# Patient Record
Sex: Male | Born: 1995 | Race: White | Hispanic: No | Marital: Single | Smoking: Never smoker
Health system: Southern US, Community
[De-identification: ages and names within clinical notes are randomized; demographics above are authoritative.]

## PROBLEM LIST (undated history)

## (undated) DIAGNOSIS — G809 Cerebral palsy, unspecified: Secondary | ICD-10-CM

## (undated) DIAGNOSIS — E079 Disorder of thyroid, unspecified: Secondary | ICD-10-CM

## (undated) DIAGNOSIS — Q820 Hereditary lymphedema: Secondary | ICD-10-CM

## (undated) DIAGNOSIS — R531 Weakness: Secondary | ICD-10-CM

## (undated) DIAGNOSIS — H9190 Unspecified hearing loss, unspecified ear: Secondary | ICD-10-CM

## (undated) DIAGNOSIS — Q9389 Other deletions from the autosomes: Secondary | ICD-10-CM

## (undated) HISTORY — PX: GASTROSTOMY TUBE PLACEMENT: SHX655

## (undated) HISTORY — PX: HERNIA REPAIR: SHX51

## (undated) HISTORY — PX: COARCTATION OF AORTA REPAIR: SHX261

---
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
## 2016-09-28 ENCOUNTER — Encounter (HOSPITAL_BASED_OUTPATIENT_CLINIC_OR_DEPARTMENT_OTHER): Payer: Self-pay

## 2016-09-28 ENCOUNTER — Emergency Department (HOSPITAL_BASED_OUTPATIENT_CLINIC_OR_DEPARTMENT_OTHER)
Admission: EM | Admit: 2016-09-28 | Discharge: 2016-09-28 | Disposition: A | Discharge status: Home / Self Care | Payer: Medicaid Other | Attending: Emergency Medicine | Admitting: Emergency Medicine

## 2016-09-28 ENCOUNTER — Emergency Department (HOSPITAL_BASED_OUTPATIENT_CLINIC_OR_DEPARTMENT_OTHER): Payer: Medicaid Other

## 2016-09-28 DIAGNOSIS — Y929 Unspecified place or not applicable: Secondary | ICD-10-CM | POA: Diagnosis not present

## 2016-09-28 DIAGNOSIS — S0990XA Unspecified injury of head, initial encounter: Secondary | ICD-10-CM | POA: Diagnosis present

## 2016-09-28 DIAGNOSIS — S0003XA Contusion of scalp, initial encounter: Secondary | ICD-10-CM | POA: Diagnosis not present

## 2016-09-28 DIAGNOSIS — Z79899 Other long term (current) drug therapy: Secondary | ICD-10-CM | POA: Insufficient documentation

## 2016-09-28 DIAGNOSIS — Y939 Activity, unspecified: Secondary | ICD-10-CM | POA: Diagnosis not present

## 2016-09-28 DIAGNOSIS — X58XXXA Exposure to other specified factors, initial encounter: Secondary | ICD-10-CM | POA: Diagnosis not present

## 2016-09-28 DIAGNOSIS — E039 Hypothyroidism, unspecified: Secondary | ICD-10-CM | POA: Insufficient documentation

## 2016-09-28 DIAGNOSIS — Y999 Unspecified external cause status: Secondary | ICD-10-CM | POA: Diagnosis not present

## 2016-09-28 HISTORY — DX: Unspecified hearing loss, unspecified ear: H91.90

## 2016-09-28 HISTORY — DX: Weakness: R53.1

## 2016-09-28 HISTORY — DX: Disorder of thyroid, unspecified: E07.9

## 2016-09-28 MED ORDER — MIDAZOLAM HCL 2 MG/ML PO SYRP: 2.0000 mg | ORAL_SOLUTION | Freq: Once | ORAL | Status: DC

## 2016-09-28 MED ORDER — MIDAZOLAM HCL 2 MG/ML PO SYRP
2.0000 mg | ORAL_SOLUTION | Freq: Once | ORAL | Status: AC
  Administered 2016-09-28: 2 mg
  Filled 2016-09-28: qty 2

## 2016-09-28 NOTE — ED Triage Notes (Addendum)
Pt's mother states she noticed some swelling to the L side of his head that started yesterday which became noticeably worse today. Pt's mother states that the area does not seem to bother him and he does not appear in pain. Pt's mother and caregiver deny trauma  Pt is non-verbal with incomprehensible vocalizations at baseline per hx.

## 2016-09-28 NOTE — Discharge Instructions (Signed)
Return to the ED with any concerns including vomiting, seizure activity, decreased level of alertness/lethargy, or any other alarming symptoms °

## 2016-09-28 NOTE — ED Provider Notes (Signed)
[facility]-EMERGENCY DEPT Provider Note   CSN: [id number] Arrival date & time: 09/28/16  1656     History   Chief Complaint Chief Complaint  Patient presents with  . Head Swelling    HPI Elijah Stanley is a 21 y.o. male.  HPI  Pt presenting with c/o area of swelling on the left side of his head.  He has hx of severe developmental delay and cerebral palsy- mom states he does bang his head, she is not aware of any specific injury.  She first noted the area yesterday but it has increased in size, he has been acting at his baseline. He is nonverbal but makes vocalizations and these are at baseline.  He seems to act as though it is painful to touch the area on the left side of head.  No seizure activity.  No LOC.  No vomiting.  There are no other associated systemic symptoms, there are no other alleviating or modifying factors.   Past Medical History:  Diagnosis Date  . Cerebral palsy (HCC)   . Chromosome 1 deletion syndrome   . Hearing loss    bilateral  . Milroy lymphedema   . Thyroid disease    hypothyroidism  . Unilateral weakness    Left    There are no active problems to display for this patient.   Past Surgical History:  Procedure Laterality Date  . COARCTATION OF AORTA REPAIR    . GASTROSTOMY TUBE PLACEMENT    . HERNIA REPAIR         Home Medications    Prior to Admission medications   Medication Sig Start Date End Date Taking? Authorizing Provider  amoxicillin-clavulanate (AUGMENTIN) 250-62.5 MG/5ML suspension Take 7.8 mLs (390 mg total) by mouth 3 (three) times daily. 10/07/14   Rolland PorterMark James, MD  levothyroxine (SYNTHROID, LEVOTHROID) 50 MCG tablet Take 50 mcg by mouth daily before breakfast.    Historical Provider, MD  Omeprazole (PRILOSEC PO) Take by mouth.    Historical Provider, MD  polyethylene glycol (MIRALAX / GLYCOLAX) packet Take 17 g by mouth daily.    Historical Provider, MD    Family History No family history on file.  Social History Social  History  Substance Use Topics  . Smoking status: Never Smoker  . Smokeless tobacco: Never Used  . Alcohol use No     Allergies   Rocephin [ceftriaxone sodium in dextrose]   Review of Systems Review of Systems  ROS reviewed and all otherwise negative except for mentioned in HPI   Physical Exam Updated Vital Signs BP 122/82 (BP Location: Right Arm)   Pulse 96   Temp 97.8 F (36.6 C) (Axillary)   Resp 16   Ht 4\' 2"  (1.27 m)   Wt 27.2 kg   SpO2 100%   BMI 16.87 kg/m  Vitals reviewed Physical Exam Physical Examination: General appearance -alert, nonspecific vocalizations, moving around bed, holding my hand Mental status - alert, not oriented, this is his baseline Head- contusion overlying left temporal area of scalp, tender to palpation, no stepoffs Eyes - no conjunctival injection, no scleral icterus Ears - bilateral TM's and external ear canals normal, no hemotympanum Mouth - mucous membranes moist, pharynx normal without lesions Neck - supple, no significant adenopathy Chest - clear to auscultation, no wheezes, rales or rhonchi, symmetric air entry Heart - normal rate, regular rhythm, normal S1, S2, no murmurs, rubs, clicks or gallops Abdomen - soft, nontender, nondistended, no masses or organomegaly Neurological - alert, vocalizing, moving all extremities,  at baseline per mother Extremities - peripheral pulses normal, no pedal edema, no clubbing or cyanosis Skin - normal coloration and turgor, no rashes  ED Treatments / Results  Labs (all labs ordered are listed, but only abnormal results are displayed) Labs Reviewed - No data to display  EKG  EKG Interpretation None       Radiology Ct Head Wo Contrast  Result Date: 09/28/2016 CLINICAL DATA:  Swelling of the left side of the head worsening over the day. History of cerebral palsy. EXAM: CT HEAD WITHOUT CONTRAST TECHNIQUE: Contiguous axial images were obtained from the base of the skull through the vertex  without intravenous contrast. COMPARISON:  None. 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 Calcification/ lipoma in the minimal dysplastic corpus callosum . 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 No evidence of acquired brain pathology. No sign of obstructive hydrocephalus. No extra-axial collection. No hemorrhage. Vascular: No vascular finding. Skull: No traumatic calvarial finding. Sinuses/Orbits: Clear/normal Other: Swelling of the left temporal soft tissues, completely nonspecific. This could be due to inflammation or hemorrhage. IMPRESSION: Nonspecific swelling of the left temporal extracranial soft tissues. This could be due to inflammation or hemorrhage. Multiple congenital brain anomalies. 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 Markedly hypoplastic corpus callosum with calcification. Electronically Signed   By: Paulina Fusi M.D.   On: 09/28/2016 19:21    Procedures Procedures (including critical care time)  Medications Ordered in ED Medications  midazolam (VERSED) 2 MG/ML syrup 2 mg (2 mg Per Tube Given 09/28/16 1843)     Initial Impression / Assessment and Plan / ED Course  I have reviewed the triage vital signs and the nursing notes.  Pertinent labs & imaging results that were available during my care of the patient were reviewed by me and considered in my medical decision making (see chart for details).     Pt with hematoma overlying left side of scalp, no specific injury known but patient does engage in head banging at times per mom.  Pt given oral versed and was able to tolerate having CT scan performed.  This showed an abnormal brain c/w patient's underlying condition but no intracranial  bleed or skull fracture.  Discharged with strict return precautions.  Pt agreeable with plan.  Final Clinical Impressions(s) / ED Diagnoses   Final diagnoses:  Head trauma  Contusion of scalp, initial encounter    New Prescriptions Discharge Medication List as of 09/28/2016  7:40 PM       Jerelyn Scott, MD 09/29/16 2016

## 2017-02-01 ENCOUNTER — Telehealth (INDEPENDENT_AMBULATORY_CARE_PROVIDER_SITE_OTHER): Payer: Self-pay

## 2017-02-01 NOTE — Telephone Encounter (Signed)
Dustin with [facility] would like to know if you have received a form for PA for Dr. Ophelia CharterYates to sign and office notes from 04/20/2016-10/18/2016 faxed to [phone].  Cb# is [phone].

## 2017-02-02 NOTE — Telephone Encounter (Signed)
IC, SPOKE TO DUSTIN AND ADVISED I HAVE NOT SEEN HIS FAX AND SUGGESTED THAT HE REFAX TO [phone]

## 2017-02-02 NOTE — Telephone Encounter (Signed)
Are you aware if you have seen this come in? Thanks.

## 2017-03-23 DIAGNOSIS — F411 Generalized anxiety disorder: Secondary | ICD-10-CM | POA: Diagnosis present

## 2020-07-31 ENCOUNTER — Ambulatory Visit: Payer: Medicaid Other | Attending: Internal Medicine

## 2020-07-31 DIAGNOSIS — Z23 Encounter for immunization: Secondary | ICD-10-CM

## 2020-07-31 NOTE — Progress Notes (Signed)
   Covid-19 Vaccination Clinic  Name:  RAMIRO PANGILINAN    MRN: [id number] DOB: 1995-08-24  07/31/2020  Mr. Lucena was observed post Covid-19 immunization for 15 minutes without incident. He was provided with Vaccine Information Sheet and instruction to access the V-Safe system.   Mr. Sowash was instructed to call 911 with any severe reactions post vaccine: Marland Kitchen Difficulty breathing  . Swelling of face and throat  . A fast heartbeat  . A bad rash all over body  . Dizziness and weakness   Immunizations Administered    Name Date Dose VIS Date Route   Pfizer COVID-19 Vaccine 07/31/2020  2:28 PM 0.3 mL 06/03/2020 Intramuscular   Manufacturer: [employer], [employer]   Lot: [id number]   NDC: [address]-4

## 2020-09-04 ENCOUNTER — Emergency Department (HOSPITAL_BASED_OUTPATIENT_CLINIC_OR_DEPARTMENT_OTHER): Payer: Medicaid Other

## 2020-09-04 ENCOUNTER — Encounter (HOSPITAL_BASED_OUTPATIENT_CLINIC_OR_DEPARTMENT_OTHER): Payer: Self-pay | Admitting: Emergency Medicine

## 2020-09-04 ENCOUNTER — Inpatient Hospital Stay (HOSPITAL_BASED_OUTPATIENT_CLINIC_OR_DEPARTMENT_OTHER)
Admission: EM | Admit: 2020-09-04 | Discharge: 2020-09-08 | DRG: 392 | Disposition: A | Discharge status: Home / Self Care | Payer: Medicaid Other | Attending: Family Medicine | Admitting: Family Medicine

## 2020-09-04 ENCOUNTER — Other Ambulatory Visit: Payer: Self-pay

## 2020-09-04 DIAGNOSIS — Z20822 Contact with and (suspected) exposure to covid-19: Secondary | ICD-10-CM | POA: Diagnosis present

## 2020-09-04 DIAGNOSIS — R630 Anorexia: Secondary | ICD-10-CM | POA: Diagnosis present

## 2020-09-04 DIAGNOSIS — R1310 Dysphagia, unspecified: Secondary | ICD-10-CM | POA: Diagnosis present

## 2020-09-04 DIAGNOSIS — F419 Anxiety disorder, unspecified: Secondary | ICD-10-CM | POA: Diagnosis present

## 2020-09-04 DIAGNOSIS — E079 Disorder of thyroid, unspecified: Secondary | ICD-10-CM | POA: Diagnosis present

## 2020-09-04 DIAGNOSIS — E876 Hypokalemia: Secondary | ICD-10-CM | POA: Diagnosis present

## 2020-09-04 DIAGNOSIS — E860 Dehydration: Secondary | ICD-10-CM | POA: Diagnosis present

## 2020-09-04 DIAGNOSIS — K56609 Unspecified intestinal obstruction, unspecified as to partial versus complete obstruction: Secondary | ICD-10-CM

## 2020-09-04 DIAGNOSIS — Z888 Allergy status to other drugs, medicaments and biological substances status: Secondary | ICD-10-CM

## 2020-09-04 DIAGNOSIS — Z931 Gastrostomy status: Secondary | ICD-10-CM

## 2020-09-04 DIAGNOSIS — K529 Noninfective gastroenteritis and colitis, unspecified: Secondary | ICD-10-CM | POA: Diagnosis present

## 2020-09-04 DIAGNOSIS — G809 Cerebral palsy, unspecified: Secondary | ICD-10-CM | POA: Diagnosis present

## 2020-09-04 DIAGNOSIS — Z0189 Encounter for other specified special examinations: Secondary | ICD-10-CM

## 2020-09-04 DIAGNOSIS — H9190 Unspecified hearing loss, unspecified ear: Secondary | ICD-10-CM | POA: Diagnosis present

## 2020-09-04 DIAGNOSIS — Z79890 Hormone replacement therapy (postmenopausal): Secondary | ICD-10-CM

## 2020-09-04 DIAGNOSIS — Z79899 Other long term (current) drug therapy: Secondary | ICD-10-CM

## 2020-09-04 DIAGNOSIS — Z6825 Body mass index (BMI) 25.0-25.9, adult: Secondary | ICD-10-CM

## 2020-09-04 DIAGNOSIS — E039 Hypothyroidism, unspecified: Secondary | ICD-10-CM | POA: Diagnosis present

## 2020-09-04 LAB — CBC WITH DIFFERENTIAL/PLATELET
Abs Immature Granulocytes: 0.1 10*3/uL — ABNORMAL HIGH (ref 0.00–0.07)
Basophils Absolute: 0.1 10*3/uL (ref 0.0–0.1)
Basophils Relative: 1 %
Eosinophils Absolute: 0 10*3/uL (ref 0.0–0.5)
Eosinophils Relative: 0 %
HCT: 48.2 % (ref 39.0–52.0)
Hemoglobin: 16.1 g/dL (ref 13.0–17.0)
Immature Granulocytes: 1 %
Lymphocytes Relative: 13 %
Lymphs Abs: 1.5 10*3/uL (ref 0.7–4.0)
MCH: 29.9 pg (ref 26.0–34.0)
MCHC: 33.4 g/dL (ref 30.0–36.0)
MCV: 89.6 fL (ref 80.0–100.0)
Monocytes Absolute: 1.6 10*3/uL — ABNORMAL HIGH (ref 0.1–1.0)
Monocytes Relative: 14 %
Neutro Abs: 8.3 10*3/uL — ABNORMAL HIGH (ref 1.7–7.7)
Neutrophils Relative %: 71 %
Platelets: 215 10*3/uL (ref 150–400)
RBC: 5.38 MIL/uL (ref 4.22–5.81)
RDW: 14.1 % (ref 11.5–15.5)
Smear Review: NORMAL
WBC: 11.6 10*3/uL — ABNORMAL HIGH (ref 4.0–10.5)
nRBC: 0 % (ref 0.0–0.2)

## 2020-09-04 LAB — COMPREHENSIVE METABOLIC PANEL
ALT: 15 U/L (ref 0–44)
AST: 22 U/L (ref 15–41)
Albumin: 2.7 g/dL — ABNORMAL LOW (ref 3.5–5.0)
Alkaline Phosphatase: 58 U/L (ref 38–126)
Anion gap: 8 (ref 5–15)
BUN: 24 mg/dL — ABNORMAL HIGH (ref 6–20)
CO2: 18 mmol/L — ABNORMAL LOW (ref 22–32)
Calcium: 6.1 mg/dL — CL (ref 8.9–10.3)
Chloride: 112 mmol/L — ABNORMAL HIGH (ref 98–111)
Creatinine, Ser: 0.47 mg/dL — ABNORMAL LOW (ref 0.61–1.24)
GFR, Estimated: >60 mL/min (ref >60)
Glucose, Bld: 74 mg/dL (ref 70–99)
Potassium: 3.3 mmol/L — ABNORMAL LOW (ref 3.5–5.1)
Sodium: 138 mmol/L (ref 135–145)
Total Bilirubin: 0.9 mg/dL (ref 0.3–1.2)
Total Protein: 4.7 g/dL — ABNORMAL LOW (ref 6.5–8.1)

## 2020-09-04 LAB — I-STAT VENOUS BLOOD GAS, ED
Acid-Base Excess: 1 mmol/L (ref 0.0–2.0)
Bicarbonate: 26.6 mmol/L (ref 20.0–28.0)
Calcium, Ion: 1.2 mmol/L (ref 1.15–1.40)
HCT: 46 % (ref 39.0–52.0)
Hemoglobin: 15.6 g/dL (ref 13.0–17.0)
O2 Saturation: 78 %
Potassium: 5.9 mmol/L — ABNORMAL HIGH (ref 3.5–5.1)
Sodium: 133 mmol/L — ABNORMAL LOW (ref 135–145)
TCO2: 28 mmol/L (ref 22–32)
pCO2, Ven: 46 mmHg (ref 44.0–60.0)
pH, Ven: 7.37 (ref 7.250–7.430)
pO2, Ven: 44 mmHg (ref 32.0–45.0)

## 2020-09-04 LAB — LACTIC ACID, PLASMA: Lactic Acid, Venous: 1.6 mmol/L (ref 0.5–1.9)

## 2020-09-04 LAB — TROPONIN I (HIGH SENSITIVITY): Troponin I (High Sensitivity): <2 ng/L (ref <18)

## 2020-09-04 LAB — SARS CORONAVIRUS 2 BY RT PCR (HOSPITAL ORDER, PERFORMED IN ~~LOC~~ HOSPITAL LAB): SARS Coronavirus 2: NEGATIVE

## 2020-09-04 MED ORDER — IOHEXOL 300 MG/ML  SOLN
100.0000 mL | Freq: Once | INTRAMUSCULAR | Status: AC
  Administered 2020-09-04: 100 mL via INTRAVENOUS

## 2020-09-04 MED ORDER — LACTATED RINGERS IV SOLN: INTRAVENOUS | Status: DC

## 2020-09-04 MED ORDER — LACTATED RINGERS IV BOLUS
500.0000 mL | Freq: Once | INTRAVENOUS | Status: AC
  Administered 2020-09-04: 500 mL via INTRAVENOUS

## 2020-09-04 NOTE — ED Notes (Signed)
Critical lab received; Calcium 6.1; Dr Stevie Kern aware.

## 2020-09-04 NOTE — ED Provider Notes (Signed)
[facility] EMERGENCY DEPARTMENT Provider Note   CSN: [id number] Arrival date & time: 09/04/20  1546     History Chief Complaint  Patient presents with  . Fever  . Diarrhea    Elijah Stanley is a 25 y.o. male.  Presenting to ER with concern for decreased p.o. intake, chills, diarrhea, vomiting.  Mother reports over the past 2 or 3 days patient has stopped taking oral intake.  Normally eats solids and receives fluids through G-tube.  Had 1 episode of vomiting earlier today, nonbloody nonbilious.  Had a couple episodes of loose stool.  Very extensive medical history including history of chromosomal deletion, cerebral palsy.  Coarctation of aorta postrepair.  Gastrostomy tube placement (2012). HPI     Past Medical History:  Diagnosis Date  . Cerebral palsy (HCC)   . Chromosome 1 deletion syndrome   . Hearing loss    bilateral  . Milroy lymphedema   . Thyroid disease    hypothyroidism  . Unilateral weakness    Left    There are no problems to display for this patient.   Past Surgical History:  Procedure Laterality Date  . COARCTATION OF AORTA REPAIR    . GASTROSTOMY TUBE PLACEMENT    . HERNIA REPAIR         No family history on file.  Social History   Tobacco Use  . Smoking status: Never Smoker  . Smokeless tobacco: Never Used  Substance Use Topics  . Alcohol use: No  . Drug use: No    Home Medications Prior to Admission medications   Medication Sig Start Date End Date Taking? Authorizing Provider  amoxicillin-clavulanate (AUGMENTIN) 250-62.5 MG/5ML suspension Take 7.8 mLs (390 mg total) by mouth 3 (three) times daily. 10/07/14   Rolland Porter, MD  levothyroxine (SYNTHROID, LEVOTHROID) 50 MCG tablet Take 50 mcg by mouth daily before breakfast.    [provider]  Omeprazole (PRILOSEC PO) Take by mouth.    [provider]  polyethylene glycol (MIRALAX / GLYCOLAX) packet Take 17 g by mouth daily.    [provider]     Allergies    Rocephin [ceftriaxone sodium in dextrose]  Review of Systems   Review of Systems  Unable to perform ROS: Patient nonverbal    Physical Exam Updated Vital Signs BP 138/87 (BP Location: Left Arm)   Pulse (!) 113   Temp 98.4 F (36.9 C) (Rectal)   Resp (!) 22   Ht 4' (1.219 m)   Wt 37.2 kg   SpO2 100%   BMI 25.02 kg/m   Physical Exam Vitals and nursing note reviewed.  Constitutional:      Appearance: He is well-developed and well-nourished.     Comments: Very small young male  HENT:     Head: Normocephalic and atraumatic.  Eyes:     Conjunctiva/sclera: Conjunctivae normal.  Cardiovascular:     Rate and Rhythm: Normal rate and regular rhythm.     Heart sounds: No murmur heard.   Pulmonary:     Effort: Pulmonary effort is normal. No respiratory distress.     Breath sounds: Normal breath sounds.  Abdominal:     Palpations: Abdomen is soft.     Tenderness: There is no abdominal tenderness.     Comments: Generalized tenderness but no rebound or guarding, G-tube is intact  Musculoskeletal:        General: No edema.     Cervical back: Neck supple.     Comments: Chronic contractures  Skin:    General: Skin is warm and dry.     Coloration: Skin is pale.  Neurological:     General: No focal deficit present.     Mental Status: He is alert.     Comments: Nonverbal  Psychiatric:        Mood and Affect: Mood and affect and mood normal.        Behavior: Behavior normal.     ED Results / Procedures / Treatments   Labs (all labs ordered are listed, but only abnormal results are displayed) Labs Reviewed  CBC WITH DIFFERENTIAL/PLATELET - Abnormal; Notable for the following components:      Result Value   WBC 11.6 (*)    Neutro Abs 8.3 (*)    Monocytes Absolute 1.6 (*)    Abs Immature Granulocytes 0.10 (*)    All other components within normal limits  COMPREHENSIVE METABOLIC PANEL - Abnormal; Notable for the following components:   Potassium 3.3 (*)     Chloride 112 (*)    CO2 18 (*)    BUN 24 (*)    Creatinine, Ser 0.47 (*)    Calcium 6.1 (*)    Total Protein 4.7 (*)    Albumin 2.7 (*)    All other components within normal limits  I-STAT VENOUS BLOOD GAS, ED - Abnormal; Notable for the following components:   Sodium 133 (*)    Potassium 5.9 (*)    All other components within normal limits  SARS CORONAVIRUS 2 BY RT PCR (HOSPITAL ORDER, PERFORMED IN [facility])  CULTURE, BLOOD (ROUTINE X 2)  URINE CULTURE  CULTURE, BLOOD (SINGLE)  LACTIC ACID, PLASMA  URINALYSIS, ROUTINE W REFLEX MICROSCOPIC  CALCIUM, IONIZED  TROPONIN I (HIGH SENSITIVITY)    EKG EKG Interpretation  Date/Time:  Friday September 04 2020 19:12:02 EST Ventricular Rate:  124 PR Interval:    QRS Duration: 70 QT Interval:  276 QTC Calculation: 397 R Axis:   94 Text Interpretation: Sinus tachycardia Consider right atrial enlargement Borderline right axis deviation Repol abnrm suggests ischemia, diffuse leads no prior for comparison Confirmed by Marianna Fuss ([id number]) on 09/04/2020 7:30:53 PM   Radiology CT ABDOMEN PELVIS WO CONTRAST  Result Date: 09/04/2020 CLINICAL DATA:  Abdominal pain and fever.  Diarrhea yesterday. EXAM: CT ABDOMEN AND PELVIS WITHOUT CONTRAST TECHNIQUE: Multidetector CT imaging of the abdomen and pelvis was performed following the standard protocol without IV contrast. COMPARISON:  None. FINDINGS: Lower chest: The lung bases are clear. Dilated esophagus with air-fluid level. Incompletely evaluated. This could represent distal obstruction due to mass or stricture or postoperative change versus achalasia. Surgical clips at the EG junction. Hepatobiliary: No focal liver abnormality is seen. No gallstones, gallbladder wall thickening, or biliary dilatation. Pancreas: Unremarkable. No pancreatic ductal dilatation or surrounding inflammatory changes. Spleen: Normal in size without focal abnormality. Adrenals/Urinary Tract: Adrenal glands  are unremarkable. Kidneys are normal, without renal calculi, focal lesion, or hydronephrosis. Bladder is unremarkable. Stomach/Bowel: Gastrostomy tube inserted along the lower greater curvature of the stomach. Retention balloon is within the stomach. Stomach is mostly decompressed. The colon is filled with solid and liquid stool. Small bowel and colon are moderately distended with air-fluid levels in the small bowel. Some small bowel wall thickening is demonstrated. Changes probably represent enterocolitis but associated small-bowel obstruction is not excluded. Mesenteric lymph nodes are prominent, likely reactive or inflammatory. Vascular/Lymphatic: Normal caliber abdominal aorta. No significant retroperitoneal lymphadenopathy. Reproductive: Prostate gland is not enlarged. Other: No free air  or free fluid demonstrated in the abdomen. Musculoskeletal: No acute bony abnormalities. IMPRESSION: 1. Dilated esophagus with air-fluid level. This could represent distal obstruction due to mass or stricture or postoperative change versus achalasia. 2. Gastrostomy tube inserted along the lower greater curvature of the stomach. Retention balloon is within the stomach. 3. Small bowel and colon are moderately distended with air-fluid levels in the small bowel. Some small bowel wall thickening is demonstrated. Changes probably represent enterocolitis but associated small-bowel obstruction is also a possibility. 4. Mesenteric lymph nodes are prominent, likely reactive or inflammatory. Electronically Signed   By: Burman Nieves M.D.   On: 09/04/2020 22:39   DG Chest 1 View  Result Date: 09/04/2020 CLINICAL DATA:  Fever, diarrhea, cerebral palsy EXAM: CHEST  1 VIEW COMPARISON:  None. FINDINGS: Lung volumes are small, but are symmetric and are clear. No pneumothorax or pleural effusion. Gas seen below the right hemidiaphragm appears encapsulated and likely represents gas within the hepatic flexure of the colon. Cardiac size  within normal limits. Surgical clips seen in the region of the aortic arch in keeping with given history of coarctation repair. Pulmonary vascularity is normal. No acute bone abnormality. IMPRESSION: No active disease. Gas beneath the right hemidiaphragm likely within the hepatic flexure of the colon. Electronically Signed   By: Helyn Numbers MD   On: 09/04/2020 22:38    Procedures Procedures (including critical care time)  Medications Ordered in ED Medications  lactated ringers infusion (has no administration in time range)  lactated ringers bolus 500 mL (0 mLs Intravenous Stopped 09/04/20 1758)  lactated ringers bolus 500 mL (0 mLs Intravenous Stopped 09/04/20 2129)  iohexol (OMNIPAQUE) 300 MG/ML solution 100 mL (100 mLs Intravenous Contrast Given 09/04/20 2144)    ED Course  I have reviewed the triage vital signs and the nursing notes.  Pertinent labs & imaging results that were available during my care of the patient were reviewed by me and considered in my medical decision making (see chart for details).    MDM Rules/Calculators/A&P                         25 year old boy with history of cerebral palsy, chromosomal deletion presents to ER with concern for vomiting, loose stool, decreased p.o. intake.  History limited due to patient's baseline nonverbal status on exam patient appeared dry, abdomen mildly distended but no rebound or guarding.  Lab work noted for mild hypokalemia, hypocalcium, low bicarb.  Concern for dehydration.  Chest x-ray negative for pneumonia.  COVID-negative.  CT abdomen pelvis concerning for dilated esophagus with air-fluid level as well as dilated loops of small and large bowel concerning for possible bowel obstruction versus enterocolitis.  Reviewed case with Dr. Dwain Sarna on-call for general surgery.  No acute surgical needs at this time.  Recommend conservative management, bowel rest, G-tube to gravity.  Provided fluid bolus, started on maintenance IV fluids.   Consult to the hospitalist service, Dr. Arville Care will except to [facility].  Final Clinical Impression(s) / ED Diagnoses Final diagnoses:  Intestinal obstruction, unspecified cause, unspecified whether partial or complete (HCC)  Hypocalcemia  Dehydration    Rx / DC Orders ED Discharge Orders    None       Milagros Loll, MD 09/04/20 2348

## 2020-09-04 NOTE — ED Triage Notes (Addendum)
Per mother he started having diarrhea yesterday and appeared to be uncomfortable.  Today has had a fever and is pale.  Reports when she checked his O2 sat at home his heart rate was 140.  O2 was 96-99 at home.  Per mom pt has not been eating well however he does have a G-tube so she has been giving plenty of fluids.

## 2020-09-05 ENCOUNTER — Inpatient Hospital Stay (HOSPITAL_COMMUNITY): Payer: Medicaid Other

## 2020-09-05 DIAGNOSIS — Z79899 Other long term (current) drug therapy: Secondary | ICD-10-CM | POA: Diagnosis not present

## 2020-09-05 DIAGNOSIS — Z931 Gastrostomy status: Secondary | ICD-10-CM | POA: Diagnosis not present

## 2020-09-05 DIAGNOSIS — E039 Hypothyroidism, unspecified: Secondary | ICD-10-CM | POA: Diagnosis present

## 2020-09-05 DIAGNOSIS — F419 Anxiety disorder, unspecified: Secondary | ICD-10-CM | POA: Diagnosis present

## 2020-09-05 DIAGNOSIS — E860 Dehydration: Secondary | ICD-10-CM | POA: Diagnosis present

## 2020-09-05 DIAGNOSIS — G809 Cerebral palsy, unspecified: Secondary | ICD-10-CM | POA: Diagnosis present

## 2020-09-05 DIAGNOSIS — E079 Disorder of thyroid, unspecified: Secondary | ICD-10-CM | POA: Diagnosis present

## 2020-09-05 DIAGNOSIS — H9190 Unspecified hearing loss, unspecified ear: Secondary | ICD-10-CM | POA: Diagnosis present

## 2020-09-05 DIAGNOSIS — Z6825 Body mass index (BMI) 25.0-25.9, adult: Secondary | ICD-10-CM | POA: Diagnosis not present

## 2020-09-05 DIAGNOSIS — R1310 Dysphagia, unspecified: Secondary | ICD-10-CM | POA: Diagnosis present

## 2020-09-05 DIAGNOSIS — Z79890 Hormone replacement therapy (postmenopausal): Secondary | ICD-10-CM | POA: Diagnosis not present

## 2020-09-05 DIAGNOSIS — K529 Noninfective gastroenteritis and colitis, unspecified: Secondary | ICD-10-CM | POA: Diagnosis present

## 2020-09-05 DIAGNOSIS — Z888 Allergy status to other drugs, medicaments and biological substances status: Secondary | ICD-10-CM | POA: Diagnosis not present

## 2020-09-05 DIAGNOSIS — R630 Anorexia: Secondary | ICD-10-CM | POA: Diagnosis present

## 2020-09-05 DIAGNOSIS — Z20822 Contact with and (suspected) exposure to covid-19: Secondary | ICD-10-CM | POA: Diagnosis present

## 2020-09-05 DIAGNOSIS — E876 Hypokalemia: Secondary | ICD-10-CM | POA: Diagnosis present

## 2020-09-05 LAB — COMPREHENSIVE METABOLIC PANEL
ALT: 18 U/L (ref 0–44)
AST: 26 U/L (ref 15–41)
Albumin: 3.4 g/dL — ABNORMAL LOW (ref 3.5–5.0)
Alkaline Phosphatase: 79 U/L (ref 38–126)
Anion gap: 16 — ABNORMAL HIGH (ref 5–15)
BUN: 24 mg/dL — ABNORMAL HIGH (ref 6–20)
CO2: 18 mmol/L — ABNORMAL LOW (ref 22–32)
Calcium: 9.3 mg/dL (ref 8.9–10.3)
Chloride: 103 mmol/L (ref 98–111)
Creatinine, Ser: 0.96 mg/dL (ref 0.61–1.24)
GFR, Estimated: >60 mL/min (ref >60)
Glucose, Bld: 58 mg/dL — ABNORMAL LOW (ref 70–99)
Potassium: 5.1 mmol/L (ref 3.5–5.1)
Sodium: 137 mmol/L (ref 135–145)
Total Bilirubin: 1.7 mg/dL — ABNORMAL HIGH (ref 0.3–1.2)
Total Protein: 6 g/dL — ABNORMAL LOW (ref 6.5–8.1)

## 2020-09-05 LAB — URINALYSIS, ROUTINE W REFLEX MICROSCOPIC
Bacteria, UA: NONE SEEN
Bilirubin Urine: NEGATIVE
Glucose, UA: >=500 mg/dL — AB
Hgb urine dipstick: NEGATIVE
Ketones, ur: 80 mg/dL — AB
Leukocytes,Ua: NEGATIVE
Nitrite: NEGATIVE
Protein, ur: NEGATIVE mg/dL
Specific Gravity, Urine: 1.028 (ref 1.005–1.030)
pH: 5 (ref 5.0–8.0)

## 2020-09-05 LAB — CBC WITH DIFFERENTIAL/PLATELET
Abs Immature Granulocytes: 0.05 10*3/uL (ref 0.00–0.07)
Basophils Absolute: 0 10*3/uL (ref 0.0–0.1)
Basophils Relative: 1 %
Eosinophils Absolute: 0 10*3/uL (ref 0.0–0.5)
Eosinophils Relative: 0 %
HCT: 42.4 % (ref 39.0–52.0)
Hemoglobin: 14.1 g/dL (ref 13.0–17.0)
Immature Granulocytes: 1 %
Lymphocytes Relative: 18 %
Lymphs Abs: 2 10*3/uL (ref 0.7–4.0)
MCH: 30.1 pg (ref 26.0–34.0)
MCHC: 33.3 g/dL (ref 30.0–36.0)
MCV: 90.4 fL (ref 80.0–100.0)
Monocytes Absolute: 1.7 10*3/uL — ABNORMAL HIGH (ref 0.1–1.0)
Monocytes Relative: 18 %
Neutro Abs: 6.2 10*3/uL (ref 1.7–7.7)
Neutrophils Relative %: 63 %
Platelets: 158 10*3/uL (ref 150–400)
RBC: 4.69 MIL/uL (ref 4.22–5.81)
RDW: 14 % (ref 11.5–15.5)
WBC: 9.8 10*3/uL (ref 4.0–10.5)
nRBC: 0 % (ref 0.0–0.2)

## 2020-09-05 LAB — GLUCOSE, CAPILLARY
Glucose-Capillary: 173 mg/dL — ABNORMAL HIGH (ref 70–99)
Glucose-Capillary: 52 mg/dL — ABNORMAL LOW (ref 70–99)

## 2020-09-05 LAB — CULTURE, BLOOD (ROUTINE X 2)

## 2020-09-05 LAB — MAGNESIUM: Magnesium: 1.9 mg/dL (ref 1.7–2.4)

## 2020-09-05 MED ORDER — DEXTROSE-NACL 5-0.45 % IV SOLN: INTRAVENOUS | Status: DC

## 2020-09-05 MED ORDER — DEXTROSE 50 % IV SOLN
INTRAVENOUS | Status: AC
  Administered 2020-09-05: 25 mL
  Filled 2020-09-05: qty 50

## 2020-09-05 MED ORDER — POTASSIUM CHLORIDE 10 MEQ/100ML IV SOLN
10.0000 meq | INTRAVENOUS | Status: AC
  Administered 2020-09-05 (×2): 10 meq via INTRAVENOUS
  Filled 2020-09-05 (×2): qty 100

## 2020-09-05 MED ORDER — DEXTROSE 50 % IV SOLN: 12.5000 g | INTRAVENOUS | Status: AC

## 2020-09-05 MED ORDER — CALCIUM GLUCONATE-NACL 1-0.675 GM/50ML-% IV SOLN
1.0000 g | Freq: Once | INTRAVENOUS | Status: AC
  Administered 2020-09-05: 1000 mg via INTRAVENOUS
  Filled 2020-09-05: qty 50

## 2020-09-05 MED ORDER — ENOXAPARIN SODIUM 30 MG/0.3ML ~~LOC~~ SOLN
30.0000 mg | Freq: Every day | SUBCUTANEOUS | Status: DC
  Administered 2020-09-05 – 2020-09-07 (×3): 30 mg via SUBCUTANEOUS
  Filled 2020-09-05 (×3): qty 0.3

## 2020-09-05 MED ORDER — DIATRIZOATE MEGLUMINE & SODIUM 66-10 % PO SOLN
90.0000 mL | Freq: Once | ORAL | Status: AC
  Administered 2020-09-05: 90 mL via NASOGASTRIC
  Filled 2020-09-05: qty 90

## 2020-09-05 NOTE — Progress Notes (Signed)
Hypoglycemic Event  CBG: 52  Treatment: D50 25 mL (12.5 gm)  Symptoms: None  Follow-up CBG: Time: 1623 CBG Result: 173  Possible Reasons for Event: Inadequate meal intake and Other: NPO  Comments/MD notified:  Dr. Ellie Lunch, [place]

## 2020-09-05 NOTE — ED Notes (Signed)
During the shift Mom would sign language to [facility] asking if he was hungry, he signed back that he was not.  Mom states that he will hold on to urine especially if he is in a strange place. In and out cath attempted around 21:00 with no urine out.

## 2020-09-05 NOTE — H&P (Signed)
History and Physical    Elijah Stanley IOX:[id number] DOB: August 27, 1995 DOA: 09/04/2020  PCP: Truett Perna, MD  Patient coming from: [facility]  Chief Complaint: diarrhea.  HPI: Elijah Stanley is a 24 y.o. male with medical history significant of cerebral palsy, hypothyroidism. History from mother as patient is not able to give history. His mother reports that 2 days ago, the patient woke up with an episode of diarrhea. No blood noted. Didn't complain of abdominal pain. It quickly went away, but he had poor appetite for the rest of the day. This was abnormal for him, so his mother check his pulse ox machine. It showed normal oxygen sats, but an elevated heart rate. She became concerned and spoke with her PCP. It was recommended that she go to the ED.  She denies any other alleviating or aggravating factors. She denies any other treatments.   ED Course: At [facility], imaging was obtained that was concerning for enterocolitis vs SBO. General surgery was consulted. They recommended transfer to [facility] for conservative medical treatment. [facility] was called for admission.   Review of Systems:  Unable to obtain d/t mentation.  PMHx Past Medical History:  Diagnosis Date  . Cerebral palsy (HCC)   . Chromosome 1 deletion syndrome   . Hearing loss    bilateral  . Milroy lymphedema   . Thyroid disease    hypothyroidism  . Unilateral weakness    Left    PSHx Past Surgical History:  Procedure Laterality Date  . COARCTATION OF AORTA REPAIR    . GASTROSTOMY TUBE PLACEMENT    . HERNIA REPAIR      SocHx  reports that he has never smoked. He has never used smokeless tobacco. He reports that he does not drink alcohol and does not use drugs.  Allergies  Allergen Reactions  . Rocephin [Ceftriaxone Sodium In Dextrose] Rash    FamHx No family history on file.  Prior to Admission medications   Medication Sig Start Date End Date Taking? Authorizing Provider  amoxicillin-clavulanate (AUGMENTIN) 250-62.5 MG/5ML  suspension Take 7.8 mLs (390 mg total) by mouth 3 (three) times daily. 10/07/14   Rolland Porter, MD  Desvenlafaxine Succinate ER 25 MG TB24 Take 1 tablet by mouth daily. 08/25/20   [provider]  levothyroxine (SYNTHROID, LEVOTHROID) 50 MCG tablet Take 50 mcg by mouth daily before breakfast.    [provider]  Omeprazole (PRILOSEC PO) Take by mouth.    [provider]  polyethylene glycol (MIRALAX / GLYCOLAX) packet Take 17 g by mouth daily.    [provider]    Physical Exam: Vitals:   09/05/20 0000 09/05/20 0100 09/05/20 0238 09/05/20 0459  BP: (!) 131/98 (!) 133/95 (!) 124/94 108/83  Pulse: (!) 109 (!) 108 (!) 106 (!) 122  Resp: 19 19 19    Temp:   97.6 F (36.4 C)   TempSrc:   Axillary   SpO2: 100% 100% 100% 100%  Weight:      Height:        General: 25 y.o. male resting in bed in NAD Eyes: PERRL, normal sclera ENMT: Nares patent w/o discharge, orophaynx clear, ears w/o discharge/lesions/ulcers Neck: Supple, trachea midline Cardiovascular: tachy, +S1, S2, 1/6 SEM, no g/r, equal pulses throughout Respiratory: CTABL, no w/r/r, normal WOB on RA GI: BS hypokinetic, NDNT, no masses noted, no organomegaly noted, PEG noted MSK: No e/c/c, contracture and deformation of BLE, chronic discoloration of BLE. Neuro: deaf, cerebral palsy Psyc: calm, cooperative, pleasant  Labs on  Admission: I have personally reviewed following labs and imaging studies  CBC: Recent Labs  Lab 09/04/20 1638 09/04/20 1734  WBC 11.6*  --   NEUTROABS 8.3*  --   HGB 16.1 15.6  HCT 48.2 46.0  MCV 89.6  --   PLT 215  --    Basic Metabolic Panel: Recent Labs  Lab 09/04/20 1734 09/04/20 1925  NA 133* 138  K 5.9* 3.3*  CL  --  112*  CO2  --  18*  GLUCOSE  --  74  BUN  --  24*  CREATININE  --  0.47*  CALCIUM  --  6.1*   GFR: Estimated Creatinine Clearance: 57 mL/min (A) (by C-G formula based on SCr of 0.47 mg/dL (L)). Liver Function Tests: Recent Labs  Lab  09/04/20 1925  AST 22  ALT 15  ALKPHOS 58  BILITOT 0.9  PROT 4.7*  ALBUMIN 2.7*   No results for input(s): LIPASE, AMYLASE in the last 168 hours. No results for input(s): AMMONIA in the last 168 hours. Coagulation Profile: No results for input(s): INR, PROTIME in the last 168 hours. Cardiac Enzymes: No results for input(s): CKTOTAL, CKMB, CKMBINDEX, TROPONINI in the last 168 hours. BNP (last 3 results) No results for input(s): PROBNP in the last 8760 hours. HbA1C: No results for input(s): HGBA1C in the last 72 hours. CBG: No results for input(s): GLUCAP in the last 168 hours. Lipid Profile: No results for input(s): CHOL, HDL, LDLCALC, TRIG, CHOLHDL, LDLDIRECT in the last 72 hours. Thyroid Function Tests: No results for input(s): TSH, T4TOTAL, FREET4, T3FREE, THYROIDAB in the last 72 hours. Anemia Panel: No results for input(s): VITAMINB12, FOLATE, FERRITIN, TIBC, IRON, RETICCTPCT in the last 72 hours. Urine analysis: No results found for: COLORURINE, APPEARANCEUR, LABSPEC, PHURINE, GLUCOSEU, HGBUR, BILIRUBINUR, KETONESUR, PROTEINUR, UROBILINOGEN, NITRITE, LEUKOCYTESUR  Radiological Exams on Admission: CT ABDOMEN PELVIS WO CONTRAST  Result Date: 09/04/2020 CLINICAL DATA:  Abdominal pain and fever.  Diarrhea yesterday. EXAM: CT ABDOMEN AND PELVIS WITHOUT CONTRAST TECHNIQUE: Multidetector CT imaging of the abdomen and pelvis was performed following the standard protocol without IV contrast. COMPARISON:  None. FINDINGS: Lower chest: The lung bases are clear. Dilated esophagus with air-fluid level. Incompletely evaluated. This could represent distal obstruction due to mass or stricture or postoperative change versus achalasia. Surgical clips at the EG junction. Hepatobiliary: No focal liver abnormality is seen. No gallstones, gallbladder wall thickening, or biliary dilatation. Pancreas: Unremarkable. No pancreatic ductal dilatation or surrounding inflammatory changes. Spleen: Normal in  size without focal abnormality. Adrenals/Urinary Tract: Adrenal glands are unremarkable. Kidneys are normal, without renal calculi, focal lesion, or hydronephrosis. Bladder is unremarkable. Stomach/Bowel: Gastrostomy tube inserted along the lower greater curvature of the stomach. Retention balloon is within the stomach. Stomach is mostly decompressed. The colon is filled with solid and liquid stool. Small bowel and colon are moderately distended with air-fluid levels in the small bowel. Some small bowel wall thickening is demonstrated. Changes probably represent enterocolitis but associated small-bowel obstruction is not excluded. Mesenteric lymph nodes are prominent, likely reactive or inflammatory. Vascular/Lymphatic: Normal caliber abdominal aorta. No significant retroperitoneal lymphadenopathy. Reproductive: Prostate gland is not enlarged. Other: No free air or free fluid demonstrated in the abdomen. Musculoskeletal: No acute bony abnormalities. IMPRESSION: 1. Dilated esophagus with air-fluid level. This could represent distal obstruction due to mass or stricture or postoperative change versus achalasia. 2. Gastrostomy tube inserted along the lower greater curvature of the stomach. Retention balloon is within the stomach. 3. Small bowel and colon  are moderately distended with air-fluid levels in the small bowel. Some small bowel wall thickening is demonstrated. Changes probably represent enterocolitis but associated small-bowel obstruction is also a possibility. 4. Mesenteric lymph nodes are prominent, likely reactive or inflammatory. Electronically Signed   By: Burman Nieves M.D.   On: 09/04/2020 22:39   DG Chest 1 View  Result Date: 09/04/2020 CLINICAL DATA:  Fever, diarrhea, cerebral palsy EXAM: CHEST  1 VIEW COMPARISON:  None. FINDINGS: Lung volumes are small, but are symmetric and are clear. No pneumothorax or pleural effusion. Gas seen below the right hemidiaphragm appears encapsulated and likely  represents gas within the hepatic flexure of the colon. Cardiac size within normal limits. Surgical clips seen in the region of the aortic arch in keeping with given history of coarctation repair. Pulmonary vascularity is normal. No acute bone abnormality. IMPRESSION: No active disease. Gas beneath the right hemidiaphragm likely within the hepatic flexure of the colon. Electronically Signed   By: Helyn Numbers MD   On: 09/04/2020 22:38    EKG: Independently reviewed. Sinus tach, no st elevations  Assessment/Plan SBO vs enterocolitis     - admit to inpt     - Gen surg consulted by EDP: rec'd bowel rest, G tube to gravity, fluids; will implement; spoke with Dr. Rose Fillers this morning, will order SBO protocol     - correct electrolytes     - mild wbc elevation yesterday; follow up today's labs; mother says he had a short episode of diarrhea yesterday, but none since; if it returns, get sample for study  Cerebral palsy     - stable, follow  Hypokalemia Hypokalemia     - replace K+, Ca2+; check Mg2+, follow  Dehydration NGMA     - fluids, lactic acid is normal  Hypothyroidism     - continue home meds when taking PO again  DVT prophylaxis: lovenox  Code Status: FULL  Family Communication: With mother at bedside.  Consults called: EDP spoke with general surgery   Status is: Inpatient  Remains inpatient appropriate because:IV treatments appropriate due to intensity of illness or inability to take PO   Dispo: The patient is from: Home              Anticipated d/c is to: Home              Anticipated d/c date is: 2 days              Patient currently is not medically stable to d/c.   Difficult to place patient No  Teddy Spike DO [facility]  If 7PM-7AM, please contact night-coverage [email or web address]  09/05/2020, 7:37 AM

## 2020-09-06 DIAGNOSIS — K529 Noninfective gastroenteritis and colitis, unspecified: Principal | ICD-10-CM

## 2020-09-06 LAB — BASIC METABOLIC PANEL
Anion gap: 10 (ref 5–15)
BUN: 5 mg/dL — ABNORMAL LOW (ref 6–20)
CO2: 26 mmol/L (ref 22–32)
Calcium: 8.6 mg/dL — ABNORMAL LOW (ref 8.9–10.3)
Chloride: 105 mmol/L (ref 98–111)
Creatinine, Ser: 0.46 mg/dL — ABNORMAL LOW (ref 0.61–1.24)
GFR, Estimated: >60 mL/min (ref >60)
Glucose, Bld: 99 mg/dL (ref 70–99)
Potassium: 3.2 mmol/L — ABNORMAL LOW (ref 3.5–5.1)
Sodium: 141 mmol/L (ref 135–145)

## 2020-09-06 LAB — C DIFFICILE QUICK SCREEN W PCR REFLEX
C Diff antigen: NEGATIVE
C Diff interpretation: NOT DETECTED
C Diff toxin: NEGATIVE

## 2020-09-06 LAB — CALCIUM, IONIZED: Calcium, Ionized, Serum: 4.6 mg/dL (ref 4.5–5.6)

## 2020-09-06 MED ORDER — LEVOTHYROXINE SODIUM 50 MCG PO TABS
50.0000 ug | ORAL_TABLET | Freq: Every day | ORAL | Status: DC
  Administered 2020-09-07 – 2020-09-08 (×2): 50 ug via ORAL
  Filled 2020-09-06 (×2): qty 1

## 2020-09-06 MED ORDER — METRONIDAZOLE IN NACL 5-0.79 MG/ML-% IV SOLN
500.0000 mg | Freq: Three times a day (TID) | INTRAVENOUS | Status: DC
  Administered 2020-09-06 – 2020-09-07 (×3): 500 mg via INTRAVENOUS
  Filled 2020-09-06 (×3): qty 100

## 2020-09-06 MED ORDER — ZINC OXIDE 40 % EX OINT
TOPICAL_OINTMENT | CUTANEOUS | Status: DC | PRN
  Filled 2020-09-06: qty 57

## 2020-09-06 MED ORDER — RESOURCE THICKENUP CLEAR PO POWD
ORAL | Status: DC | PRN
  Filled 2020-09-06: qty 125

## 2020-09-06 MED ORDER — DESVENLAFAXINE SUCCINATE ER 25 MG PO TB24
1.0000 | ORAL_TABLET | Freq: Every day | ORAL | Status: DC
  Administered 2020-09-07: 25 mg via ORAL

## 2020-09-06 MED ORDER — STARCH (THICKENING) PO POWD: ORAL | Status: DC | PRN

## 2020-09-06 MED ORDER — CIPROFLOXACIN IN D5W 400 MG/200ML IV SOLN
400.0000 mg | Freq: Two times a day (BID) | INTRAVENOUS | Status: DC
  Administered 2020-09-06 – 2020-09-07 (×2): 400 mg via INTRAVENOUS
  Filled 2020-09-06 (×2): qty 200

## 2020-09-06 NOTE — Progress Notes (Signed)
[facility]  PROGRESS NOTE  Elijah Stanley ZOX:[id number] DOB: [birth year] DOA: 09/04/2020 PCP: Truett Perna, MD   Brief HPI:   25 year old male with medical history of several palsy, hypothyroidism, was brought to ED with complaints of diarrhea.  Also had poor appetite.  He was found to have elevated heart rate at home.  Mother was concerned so she called PCP and it was recommended that he should be seen in the ED. At [facility], CT scan of the abdomen was obtained which showed enterocolitis versus SBO.  General surgery was consulted.    Subjective   Patient seen and examined, does not appear to be in distress.  Very minimal output from G-tube.   Assessment/Plan:     1. Enterocolitis-patient continues to have diarrhea, stool for C. difficile PCR obtained was negative for C. Difficile.  G-tube suction was discontinued.  Patient was started on clear liquid diet with thickener, and tolerated well.  .  Will advance to full liquid diet.  He still having diarrhea so we will start him on Cipro and Flagyl. 2. ?  SBO-General surgery has seen the patient and ruled out SBO.  Patient having liquid stools.  He has contrast in his colon from SB protocol film. 3. Hypokalemia-replete, potassium is 5.1.  Check BMP in am. 4. Hypothyroidism-we will restart Synthroid     COVID-19 Labs  No results for input(s): DDIMER, FERRITIN, LDH, CRP in the last 72 hours.  Lab Results  Component Value Date   SARSCOV2NAA NEGATIVE 09/04/2020     Scheduled medications:   . dextrose  12.5 g Intravenous STAT  . enoxaparin (LOVENOX) injection  30 mg Subcutaneous Daily         CBG: Recent Labs  Lab 09/05/20 1552 09/05/20 1628  GLUCAP 52* 173*    SpO2: 95 %    CBC: Recent Labs  Lab 09/04/20 1638 09/04/20 1734 09/05/20 1057  WBC 11.6*  --  9.8  NEUTROABS 8.3*  --  6.2  HGB 16.1 15.6 14.1  HCT 48.2 46.0 42.4  MCV 89.6  --  90.4  PLT 215  --  158    Basic Metabolic Panel: Recent Labs  Lab  09/04/20 1734 09/04/20 1925 09/05/20 1405  NA 133* 138 137  K 5.9* 3.3* 5.1  CL  --  112* 103  CO2  --  18* 18*  GLUCOSE  --  74 58*  BUN  --  24* 24*  CREATININE  --  0.47* 0.96  CALCIUM  --  6.1* 9.3  MG  --   --  1.9     Liver Function Tests: Recent Labs  Lab 09/04/20 1925 09/05/20 1405  AST 22 26  ALT 15 18  ALKPHOS 58 79  BILITOT 0.9 1.7*  PROT 4.7* 6.0*  ALBUMIN 2.7* 3.4*     Antibiotics: Anti-infectives (From admission, onward)   None       DVT prophylaxis: Lovenox  Code Status: Full code  Family Communication: Discussed with mother at bedside.   Consultants:  General surgery  Procedures:  None    Objective   Vitals:   09/05/20 1210 09/05/20 1604 09/06/20 0547 09/06/20 1215  BP: (!) 108/55 134/74 139/89 (!) 146/96  Pulse: 99 (!) 118 93 86  Resp: 18 18  16   Temp:  (!) 97.4 F (36.3 C) 97.6 F (36.4 C)   TempSrc:  Oral    SpO2: 100%  95%   Weight:      Height:  Intake/Output Summary (Last 24 hours) at 09/06/2020 1438 Last data filed at 09/06/2020 0500 Gross per 24 hour  Intake 1434.41 ml  Output --  Net 1434.41 ml    01/21 1901 - 01/23 0700 In: 2183.2 [I.V.:1434.4] Out: -   Filed Weights   09/04/20 1557  Weight: 37.2 kg    Physical Examination:    General-appears in no acute distress  Heart-S1-S2, regular, no murmur auscultated  Lungs-clear to auscultation bilaterally, no wheezing or crackles auscultated  Abdomen-soft, nontender, no organomegaly, PEG tube in place, attached to wall suction  Extremities-no edema in the lower extremities  Neuro-alert, nonverbal, responds to nonverbal cues   Status is: Inpatient  Dispo: The patient is from: Home              Anticipated d/c is to: Home              Anticipated d/c date is: 09/07/2020              Patient currently not medically stable for discharge  Barrier to discharge-ongoing treatment for enterocolitis       Data Reviewed:   Recent Results  (from the past 240 hour(s))  SARS Coronavirus 2 by RT PCR (hospital order, performed in [facility] hospital lab) Nasopharyngeal Nasopharyngeal Swab     Status: None   Collection Time: 09/04/20  4:38 PM   Specimen: Nasopharyngeal Swab  Result Value Ref Range Status   SARS Coronavirus 2 NEGATIVE NEGATIVE Final    Comment: (NOTE) SARS-CoV-2 target nucleic acids are NOT DETECTED.  The SARS-CoV-2 RNA is generally detectable in upper and lower respiratory specimens during the acute phase of infection. The lowest concentration of SARS-CoV-2 viral copies this assay can detect is 250 copies / mL. A negative result does not preclude SARS-CoV-2 infection and should not be used as the sole basis for treatment or other patient management decisions.  A negative result may occur with improper specimen collection / handling, submission of specimen other than nasopharyngeal swab, presence of viral mutation(s) within the areas targeted by this assay, and inadequate number of viral copies (<250 copies / mL). A negative result must be combined with clinical observations, patient history, and epidemiological information.  Fact Sheet for Patients:   BoilerBrush.com.cy  Fact Sheet for Healthcare Providers: https://pope.com/  This test is not yet approved or  cleared by the [place] FDA and has been authorized for detection and/or diagnosis of SARS-CoV-2 by FDA under an Emergency Use Authorization (EUA).  This EUA will remain in effect (meaning this test can be used) for the duration of the COVID-19 declaration under Section 564(b)(1) of the Act, 21 U.S.C. section 360bbb-3(b)(1), unless the authorization is terminated or revoked sooner.  Performed at [facility], [address] [place], [place] [address]   Blood culture (routine x 2)     Status: None (Preliminary result)   Collection Time: 09/04/20  4:40 PM   Specimen: BLOOD  Result  Value Ref Range Status   Specimen Description   Final    BLOOD RIGHT HAND Performed at [facility], [address] [place], [place] [address]    Special Requests   Final    BOTTLES DRAWN AEROBIC AND ANAEROBIC Blood Culture results may not be optimal due to an inadequate volume of blood received in culture bottles Performed at [facility], [address] [place], [place] [address]    Culture   Final    NO  GROWTH 1 DAY Performed at [facility], [address] [place], [place] [address]    Report Status PENDING  Incomplete  Culture, blood (single)     Status: None (Preliminary result)   Collection Time: 09/05/20  7:12 AM   Specimen: BLOOD  Result Value Ref Range Status   Specimen Description   Final    BLOOD RIGHT ANTECUBITAL Performed at [facility], [address] [place], [place] [address]    Special Requests   Final    BOTTLES DRAWN AEROBIC ONLY Blood Culture adequate volume Performed at [facility], [address] [place], [place] [address]    Culture   Final    NO GROWTH 1 DAY Performed at [facility], [address] [place], [place] [address]    Report Status PENDING  Incomplete  C Difficile Quick Screen w PCR reflex     Status: None   Collection Time: 09/06/20  9:13 AM   Specimen: STOOL  Result Value Ref Range Status   C Diff antigen NEGATIVE NEGATIVE Final   C Diff toxin NEGATIVE NEGATIVE Final   C Diff interpretation No C. difficile detected.  Final    Comment: Performed at [facility], [address] [place], [place] [address]    No results for input(s): LIPASE, AMYLASE in the last 168 hours. No results for input(s): AMMONIA in the last 168 hours.  Cardiac Enzymes: No results for input(s): CKTOTAL, CKMB, CKMBINDEX, TROPONINI in the last 168 hours. BNP (last 3 results) No results for input(s): BNP in the last 8760 hours.  ProBNP (last 3 results) No results  for input(s): PROBNP in the last 8760 hours.  Studies:  CT ABDOMEN PELVIS WO CONTRAST  Result Date: 09/04/2020 CLINICAL DATA:  Abdominal pain and fever.  Diarrhea yesterday. EXAM: CT ABDOMEN AND PELVIS WITHOUT CONTRAST TECHNIQUE: Multidetector CT imaging of the abdomen and pelvis was performed following the standard protocol without IV contrast. COMPARISON:  None. FINDINGS: Lower chest: The lung bases are clear. Dilated esophagus with air-fluid level. Incompletely evaluated. This could represent distal obstruction due to mass or stricture or postoperative change versus achalasia. Surgical clips at the EG junction. Hepatobiliary: No focal liver abnormality is seen. No gallstones, gallbladder wall thickening, or biliary dilatation. Pancreas: Unremarkable. No pancreatic ductal dilatation or surrounding inflammatory changes. Spleen: Normal in size without focal abnormality. Adrenals/Urinary Tract: Adrenal glands are unremarkable. Kidneys are normal, without renal calculi, focal lesion, or hydronephrosis. Bladder is unremarkable. Stomach/Bowel: Gastrostomy tube inserted along the lower greater curvature of the stomach. Retention balloon is within the stomach. Stomach is mostly decompressed. The colon is filled with solid and liquid stool. Small bowel and colon are moderately distended with air-fluid levels in the small bowel. Some small bowel wall thickening is demonstrated. Changes probably represent enterocolitis but associated small-bowel obstruction is not excluded. Mesenteric lymph nodes are prominent, likely reactive or inflammatory. Vascular/Lymphatic: Normal caliber abdominal aorta. No significant retroperitoneal lymphadenopathy. Reproductive: Prostate gland is not enlarged. Other: No free air or free fluid demonstrated in the abdomen. Musculoskeletal: No acute bony abnormalities. IMPRESSION: 1. Dilated esophagus with air-fluid level. This could represent distal obstruction due to mass or stricture or  postoperative change versus achalasia. 2. Gastrostomy tube inserted along the lower greater curvature of the stomach. Retention balloon is within the stomach. 3. Small bowel and colon are moderately distended with air-fluid levels in the small bowel. Some small bowel wall thickening is demonstrated. Changes probably represent enterocolitis  but associated small-bowel obstruction is also a possibility. 4. Mesenteric lymph nodes are prominent, likely reactive or inflammatory. Electronically Signed   By: Burman Nieves M.D.   On: 09/04/2020 22:39   DG Chest 1 View  Result Date: 09/04/2020 CLINICAL DATA:  Fever, diarrhea, cerebral palsy EXAM: CHEST  1 VIEW COMPARISON:  None. FINDINGS: Lung volumes are small, but are symmetric and are clear. No pneumothorax or pleural effusion. Gas seen below the right hemidiaphragm appears encapsulated and likely represents gas within the hepatic flexure of the colon. Cardiac size within normal limits. Surgical clips seen in the region of the aortic arch in keeping with given history of coarctation repair. Pulmonary vascularity is normal. No acute bone abnormality. IMPRESSION: No active disease. Gas beneath the right hemidiaphragm likely within the hepatic flexure of the colon. Electronically Signed   By: Helyn Numbers MD   On: 09/04/2020 22:38   DG Abd Portable 1V-Small Bowel Obstruction Protocol-initial, 8 hr delay  Result Date: 09/05/2020 CLINICAL DATA:  Small bowel obstruction with 8 hour delay imaging obtained. EXAM: PORTABLE ABDOMEN - 1 VIEW COMPARISON:  CT abdomen and pelvis 09/04/2020 FINDINGS: Contrast material is seen within dilated small bowel and colon suggesting adynamic ileus versus partial small bowel obstruction. A gastrostomy tube is demonstrated in the left mid abdomen. IMPRESSION: Contrast material throughout dilated small bowel and colon likely indicating adynamic ileus although partial small bowel obstruction could have this appearance. Electronically  Signed   By: Burman Nieves M.D.   On: 09/05/2020 21:16       Drystan Reader S Jordy Hewins   [facility] If 7PM-7AM, please contact night-coverage at [email or web address], Office  [phone]   09/06/2020, 2:38 PM  LOS: 1 day

## 2020-09-06 NOTE — Plan of Care (Signed)
  Problem: Clinical Measurements: Goal: Ability to maintain clinical measurements within normal limits will improve Outcome: Progressing   Problem: Activity: Goal: Risk for activity intolerance will decrease Outcome: Progressing   Problem: Pain Managment: Goal: General experience of comfort will improve Outcome: Completed/Met   

## 2020-09-06 NOTE — Consult Note (Addendum)
Reason for Consult: abnormal CT Referring Physician: Ranferi Clingan is an 25 y.o. male.  HPI:   Pt is a 25 yo M with h/o CP.  His mother brought him to the hospital after a few days of diarrhea and abdominal pain that was not improving.  He also wasn't eating like normal.  Also, his heart rate was high at home and she was concerned about dehydration.  She doesn't recall any significant bloating.  She denies fever/chills.  He has not thrown up and she hasn't drained his G tube.  Mom is primary caretaker other than afternoon aide some afternoons.  No sick contacts of anyone in household.    He has h/o nissen and G tube as a baby.  Of note, he has dysphagia and cannot have clear liquids, but has generally all of his intake via mouth. He only gets some clear liquids via G tube.  He also is able to take most pills OK.    He had CT in ED and this showed probable enterocolitis but "associated SBO also a possibility."  He had prominent mesenteric lymph nodes as well.  He continues to have diarrhea.  SB protocol showed adynamic ileus vs SBO.    Past Medical History:  Diagnosis Date  . Cerebral palsy (HCC)   . Chromosome 1 deletion syndrome   . Hearing loss    bilateral  . [place] lymphedema   . Thyroid disease    hypothyroidism  . Unilateral weakness    Left    Past Surgical History:  Procedure Laterality Date  . COARCTATION OF AORTA REPAIR    . GASTROSTOMY TUBE PLACEMENT    . HERNIA REPAIR      No family history on file.  Social History:  reports that he has never smoked. He has never used smokeless tobacco. He reports that he does not drink alcohol and does not use drugs.  Allergies:  Allergies  Allergen Reactions  . Rocephin [Ceftriaxone Sodium In Dextrose] Rash    Medications:  Current Meds  Medication Sig  . Desvenlafaxine Succinate ER 25 MG TB24 Take 1 tablet by mouth daily.  Marland Kitchen levothyroxine (SYNTHROID, LEVOTHROID) 50 MCG tablet Take 50 mcg by mouth daily before  breakfast.  . multivitamin (VIT W/EXTRA C) CHEW chewable tablet Chew 1 tablet by mouth daily.  . Omeprazole (PRILOSEC PO) Take 20 mg by mouth as needed (heartburn). OTC  . polyethylene glycol (MIRALAX / GLYCOLAX) packet Take 17 g by mouth daily.  . [DISCONTINUED] Multiple Vitamins-Minerals (MULTIVITAMIN WITH MINERALS) tablet Take 1 tablet by mouth daily.     Results for orders placed or performed during the hospital encounter of 09/04/20 (from the past 48 hour(s))  CBC with Differential     Status: Abnormal   Collection Time: 09/04/20  4:38 PM  Result Value Ref Range   WBC 11.6 (H) 4.0 - 10.5 K/uL    Comment: WHITE COUNT CONFIRMED ON SMEAR   RBC 5.38 4.22 - 5.81 MIL/uL   Hemoglobin 16.1 13.0 - 17.0 g/dL   HCT 78.6 76.7 - 20.9 %   MCV 89.6 80.0 - 100.0 fL   MCH 29.9 26.0 - 34.0 pg   MCHC 33.4 30.0 - 36.0 g/dL   RDW 47.0 96.2 - 83.6 %   Platelets 215 150 - 400 K/uL   nRBC 0.0 0.0 - 0.2 %   Neutrophils Relative % 71 %   Neutro Abs 8.3 (H) 1.7 - 7.7 K/uL   Lymphocytes Relative 13 %  Lymphs Abs 1.5 0.7 - 4.0 K/uL   Monocytes Relative 14 %   Monocytes Absolute 1.6 (H) 0.1 - 1.0 K/uL   Eosinophils Relative 0 %   Eosinophils Absolute 0.0 0.0 - 0.5 K/uL   Basophils Relative 1 %   Basophils Absolute 0.1 0.0 - 0.1 K/uL   WBC Morphology MORPHOLOGY UNREMARKABLE    RBC Morphology MORPHOLOGY UNREMARKABLE    Smear Review Normal platelet morphology    Immature Granulocytes 1 %   Abs Immature Granulocytes 0.10 (H) 0.00 - 0.07 K/uL    Comment: Performed at [facility] [place], [address] [place], [place] [address]  SARS Coronavirus 2 by RT PCR (hospital order, performed in [facility] hospital lab) Nasopharyngeal Nasopharyngeal Swab     Status: None   Collection Time: 09/04/20  4:38 PM   Specimen: Nasopharyngeal Swab  Result Value Ref Range   SARS Coronavirus 2 NEGATIVE NEGATIVE    Comment: (NOTE) SARS-CoV-2 target nucleic acids are NOT DETECTED.  The SARS-CoV-2 RNA is generally  detectable in upper and lower respiratory specimens during the acute phase of infection. The lowest concentration of SARS-CoV-2 viral copies this assay can detect is 250 copies / mL. A negative result does not preclude SARS-CoV-2 infection and should not be used as the sole basis for treatment or other patient management decisions.  A negative result may occur with improper specimen collection / handling, submission of specimen other than nasopharyngeal swab, presence of viral mutation(s) within the areas targeted by this assay, and inadequate number of viral copies (<250 copies / mL). A negative result must be combined with clinical observations, patient history, and epidemiological information.  Fact Sheet for Patients:   BoilerBrush.com.cyhttps://www.fda.gov/media/136312/download  Fact Sheet for Healthcare Providers: https://pope.com/https://www.fda.gov/media/136313/download  This test is not yet approved or  cleared by the [place] FDA and has been authorized for detection and/or diagnosis of SARS-CoV-2 by FDA under an Emergency Use Authorization (EUA).  This EUA will remain in effect (meaning this test can be used) for the duration of the COVID-19 declaration under Section 564(b)(1) of the Act, 21 U.S.C. section 360bbb-3(b)(1), unless the authorization is terminated or revoked sooner.  Performed at [facility] [place], [address] [place], [place] [address]   Blood culture (routine x 2)     Status: None (Preliminary result)   Collection Time: 09/04/20  4:40 PM   Specimen: BLOOD  Result Value Ref Range   Specimen Description      BLOOD RIGHT HAND Performed at [facility] [place], [address] [place], [place] [address]    Special Requests      BOTTLES DRAWN AEROBIC AND ANAEROBIC Blood Culture results may not be optimal due to an inadequate volume of blood received in culture bottles Performed at [facility] [place], [address] [place], [place] [address]    Culture      NO  GROWTH < 24 HOURS Performed at [facility], [address] [place], [place] [address]    Report Status PENDING   Lactic acid, plasma     Status: None   Collection Time: 09/04/20  5:15 PM  Result Value Ref Range   Lactic Acid, Venous 1.6 0.5 - 1.9 mmol/L    Comment: Performed at [facility] [place], [address] [place], [place] [address]  I-Stat venous blood gas, ED     Status: Abnormal   Collection Time: 09/04/20  5:34 PM  Result Value  Ref Range   pH, Ven 7.370 7.250 - 7.430   pCO2, Ven 46.0 44.0 - 60.0 mmHg   pO2, Ven 44.0 32.0 - 45.0 mmHg   Bicarbonate 26.6 20.0 - 28.0 mmol/L   TCO2 28 22 - 32 mmol/L   O2 Saturation 78.0 %   Acid-Base Excess 1.0 0.0 - 2.0 mmol/L   Sodium 133 (L) 135 - 145 mmol/L   Potassium 5.9 (H) 3.5 - 5.1 mmol/L   Calcium, Ion 1.20 1.15 - 1.40 mmol/L   HCT 46.0 39.0 - 52.0 %   Hemoglobin 15.6 13.0 - 17.0 g/dL   Sample type VENOUS   Comprehensive metabolic panel     Status: Abnormal   Collection Time: 09/04/20  7:25 PM  Result Value Ref Range   Sodium 138 135 - 145 mmol/L   Potassium 3.3 (L) 3.5 - 5.1 mmol/L    Comment: MODERATE HEMOLYSIS HEMOLYSIS AT THIS LEVEL MAY AFFECT RESULT DELTA CHECK NOTED    Chloride 112 (H) 98 - 111 mmol/L   CO2 18 (L) 22 - 32 mmol/L   Glucose, Bld 74 70 - 99 mg/dL    Comment: Glucose reference range applies only to samples taken after fasting for at least 8 hours.   BUN 24 (H) 6 - 20 mg/dL   Creatinine, Ser 5.05 (L) 0.61 - 1.24 mg/dL   Calcium 6.1 (LL) 8.9 - 10.3 mg/dL    Comment: CRITICAL RESULT CALLED TO, READ BACK BY AND VERIFIED WITH: WALTON MEREDITH RN AT 1957 ON 09/04/20 BY [email or web address]    Total Protein 4.7 (L) 6.5 - 8.1 g/dL   Albumin 2.7 (L) 3.5 - 5.0 g/dL   AST 22 15 - 41 U/L   ALT 15 0 - 44 U/L   Alkaline Phosphatase 58 38 - 126 U/L   Total Bilirubin 0.9 0.3 - 1.2 mg/dL   GFR, Estimated >39 >76 mL/min    Comment: (NOTE) Calculated using the CKD-EPI Creatinine Equation (2021)    Anion gap 8 5 -  15    Comment: Performed at [facility], [address] [place], [place] [address]  Troponin I (High Sensitivity)     Status: None   Collection Time: 09/04/20  7:32 PM  Result Value Ref Range   Troponin I (High Sensitivity) <2 <18 ng/L    Comment: (NOTE) Elevated high sensitivity troponin I (hsTnI) values and significant  changes across serial measurements may suggest ACS but many other  chronic and acute conditions are known to elevate hsTnI results.  Refer to the "Links" section for chest pain algorithms and additional  guidance. Performed at [facility], [address] [place], [place] [address]   Culture, blood (single)     Status: None (Preliminary result)   Collection Time: 09/05/20  7:12 AM   Specimen: BLOOD  Result Value Ref Range   Specimen Description      BLOOD RIGHT ANTECUBITAL Performed at [facility], [address], [place], [place] [address]    Special Requests      BOTTLES DRAWN AEROBIC ONLY Blood Culture adequate volume Performed at [facility], [address], [place], [place] [address]    Culture      NO GROWTH < 12 HOURS Performed at [facility], [address], [place], [place] [address]    Report Status PENDING   CBC WITH DIFFERENTIAL     Status: Abnormal   Collection Time: 09/05/20 10:57 AM  Result Value  Ref Range   WBC 9.8 4.0 - 10.5 K/uL   RBC 4.69 4.22 - 5.81 MIL/uL   Hemoglobin 14.1 13.0 - 17.0 g/dL   HCT 16.142.4 09.639.0 - 04.552.0 %   MCV 90.4 80.0 - 100.0 fL   MCH 30.1 26.0 - 34.0 pg   MCHC 33.3 30.0 - 36.0 g/dL   RDW 40.914.0 81.111.5 - 91.415.5 %   Platelets 158 150 - 400 K/uL   nRBC 0 0.0 - 0.2 %   Neutrophils Relative % 63 %   Neutro Abs 6.2 1.7 - 7.7 K/uL   Lymphocytes Relative 18 %   Lymphs Abs 2.0 0.7 - 4.0 K/uL   Monocytes Relative 18 %   Monocytes Absolute 1.7 (H) 0.1 - 1.0 K/uL   Eosinophils Relative 0 %   Eosinophils Absolute 0.0 0.0 - 0.5 K/uL   Basophils Relative 1 %    Basophils Absolute 0.0 0.0 - 0.1 K/uL   WBC Morphology DOHLE BODIES     Comment: ATYPICAL LYMPHS   RBC Morphology POLYCHROMASIA PRESENT    Immature Granulocytes 1 %   Abs Immature Granulocytes 0.05 0.00 - 0.07 K/uL    Comment: Performed at [facility], [address] [place], [place] [address]  Comprehensive metabolic panel     Status: Abnormal   Collection Time: 09/05/20  2:05 PM  Result Value Ref Range   Sodium 137 135 - 145 mmol/L   Potassium 5.1 3.5 - 5.1 mmol/L    Comment: SLIGHT HEMOLYSIS   Chloride 103 98 - 111 mmol/L   CO2 18 (L) 22 - 32 mmol/L   Glucose, Bld 58 (L) 70 - 99 mg/dL    Comment: Glucose reference range applies only to samples taken after fasting for at least 8 hours.   BUN 24 (H) 6 - 20 mg/dL   Creatinine, Ser 6.210.96 0.61 - 1.24 mg/dL   Calcium 9.3 8.9 - 30.810.3 mg/dL   Total Protein 6.0 (L) 6.5 - 8.1 g/dL   Albumin 3.4 (L) 3.5 - 5.0 g/dL   AST 26 15 - 41 U/L   ALT 18 0 - 44 U/L   Alkaline Phosphatase 79 38 - 126 U/L   Total Bilirubin 1.7 (H) 0.3 - 1.2 mg/dL   GFR, Estimated >65>60 >78>60 mL/min    Comment: (NOTE) Calculated using the CKD-EPI Creatinine Equation (2021)    Anion gap 16 (H) 5 - 15    Comment: Performed at [facility], [address] [place], [place] [address]  Magnesium     Status: None   Collection Time: 09/05/20  2:05 PM  Result Value Ref Range   Magnesium 1.9 1.7 - 2.4 mg/dL    Comment: Performed at [facility], [address] [place], [place] [address]  Glucose, capillary     Status: Abnormal   Collection Time: 09/05/20  3:52 PM  Result Value Ref Range   Glucose-Capillary 52 (L) 70 - 99 mg/dL    Comment: Glucose reference range applies only to samples taken after fasting for at least 8 hours.  Glucose, capillary     Status: Abnormal   Collection Time: 09/05/20  4:28 PM  Result Value Ref Range   Glucose-Capillary 173 (H) 70 - 99 mg/dL    Comment: Glucose reference range applies  only to samples taken after fasting for at least 8 hours.    CT ABDOMEN PELVIS WO CONTRAST  Result Date: 09/04/2020 CLINICAL DATA:  Abdominal pain and fever.  Diarrhea yesterday. EXAM:  CT ABDOMEN AND PELVIS WITHOUT CONTRAST TECHNIQUE: Multidetector CT imaging of the abdomen and pelvis was performed following the standard protocol without IV contrast. COMPARISON:  None. FINDINGS: Lower chest: The lung bases are clear. Dilated esophagus with air-fluid level. Incompletely evaluated. This could represent distal obstruction due to mass or stricture or postoperative change versus achalasia. Surgical clips at the EG junction. Hepatobiliary: No focal liver abnormality is seen. No gallstones, gallbladder wall thickening, or biliary dilatation. Pancreas: Unremarkable. No pancreatic ductal dilatation or surrounding inflammatory changes. Spleen: Normal in size without focal abnormality. Adrenals/Urinary Tract: Adrenal glands are unremarkable. Kidneys are normal, without renal calculi, focal lesion, or hydronephrosis. Bladder is unremarkable. Stomach/Bowel: Gastrostomy tube inserted along the lower greater curvature of the stomach. Retention balloon is within the stomach. Stomach is mostly decompressed. The colon is filled with solid and liquid stool. Small bowel and colon are moderately distended with air-fluid levels in the small bowel. Some small bowel wall thickening is demonstrated. Changes probably represent enterocolitis but associated small-bowel obstruction is not excluded. Mesenteric lymph nodes are prominent, likely reactive or inflammatory. Vascular/Lymphatic: Normal caliber abdominal aorta. No significant retroperitoneal lymphadenopathy. Reproductive: Prostate gland is not enlarged. Other: No free air or free fluid demonstrated in the abdomen. Musculoskeletal: No acute bony abnormalities. IMPRESSION: 1. Dilated esophagus with air-fluid level. This could represent distal obstruction due to mass or stricture or  postoperative change versus achalasia. 2. Gastrostomy tube inserted along the lower greater curvature of the stomach. Retention balloon is within the stomach. 3. Small bowel and colon are moderately distended with air-fluid levels in the small bowel. Some small bowel wall thickening is demonstrated. Changes probably represent enterocolitis but associated small-bowel obstruction is also a possibility. 4. Mesenteric lymph nodes are prominent, likely reactive or inflammatory. Electronically Signed   By: Burman Nieves M.D.   On: 09/04/2020 22:39   DG Chest 1 View  Result Date: 09/04/2020 CLINICAL DATA:  Fever, diarrhea, cerebral palsy EXAM: CHEST  1 VIEW COMPARISON:  None. FINDINGS: Lung volumes are small, but are symmetric and are clear. No pneumothorax or pleural effusion. Gas seen below the right hemidiaphragm appears encapsulated and likely represents gas within the hepatic flexure of the colon. Cardiac size within normal limits. Surgical clips seen in the region of the aortic arch in keeping with given history of coarctation repair. Pulmonary vascularity is normal. No acute bone abnormality. IMPRESSION: No active disease. Gas beneath the right hemidiaphragm likely within the hepatic flexure of the colon. Electronically Signed   By: Helyn Numbers MD   On: 09/04/2020 22:38   DG Abd Portable 1V-Small Bowel Obstruction Protocol-initial, 8 hr delay  Result Date: 09/05/2020 CLINICAL DATA:  Small bowel obstruction with 8 hour delay imaging obtained. EXAM: PORTABLE ABDOMEN - 1 VIEW COMPARISON:  CT abdomen and pelvis 09/04/2020 FINDINGS: Contrast material is seen within dilated small bowel and colon suggesting adynamic ileus versus partial small bowel obstruction. A gastrostomy tube is demonstrated in the left mid abdomen. IMPRESSION: Contrast material throughout dilated small bowel and colon likely indicating adynamic ileus although partial small bowel obstruction could have this appearance. Electronically  Signed   By: Burman Nieves M.D.   On: 09/05/2020 21:16    Review of Systems  Unable to perform ROS: Patient nonverbal   Blood pressure 139/89, pulse 93, temperature 97.6 F (36.4 C), resp. rate 18, height 4' (1.219 m), weight 37.2 kg, SpO2 95 %. Physical Exam Constitutional:      General: He is not in acute distress.  Appearance: Normal appearance. He is not ill-appearing.     Comments: Very small 25 yo.  Beautiful smile and interactive.    HENT:     Head:     Comments: Small and slightly tapered face.      Right Ear: External ear normal.     Left Ear: External ear normal.     Nose: Nose normal.     Mouth/Throat:     Mouth: Mucous membranes are moist.  Eyes:     General: No scleral icterus.       Right eye: No discharge.        Left eye: No discharge.     Conjunctiva/sclera: Conjunctivae normal.     Pupils: Pupils are equal, round, and reactive to light.  Cardiovascular:     Rate and Rhythm: Normal rate and regular rhythm.     Pulses: Normal pulses.  Pulmonary:     Effort: Pulmonary effort is normal.  Abdominal:     General: Abdomen is flat. There is no distension.     Palpations: Abdomen is soft. There is no mass.     Tenderness: There is no abdominal tenderness. There is no guarding or rebound.     Hernia: No hernia is present.     Comments: G tube in place without irritation. No HSM.  Musculoskeletal:        General: Deformity present. No swelling, tenderness or signs of injury.     Cervical back: Neck supple.     Right lower leg: No edema.     Left lower leg: No edema.     Comments: Contractures on upper wrists  Skin:    General: Skin is warm and dry.     Capillary Refill: Capillary refill takes 2 to 3 seconds.  Neurological:     General: No focal deficit present.     Mental Status: He is alert and oriented to person, place, and time.  Psychiatric:        Mood and Affect: Mood normal.        Behavior: Behavior normal.      Assessment/Plan: Enterocolitis with diarrhea CP G tube in place   Pt with symptoms consistent with enterocolitis.  Pain has resolved.  He does not have symptoms of obstruction given lack of vomiting or need to vent G tube and continued stool.  He has contrast in his colon as well on SB protocol film.  Diffusely dilated small and large bowel with thickening.    Recommend continued medical management of diarrhea.  Looks like c diff has been ordered but isn't sent yet.    Diet per primary team.  Call if further questions.     Maudry Diego, MD FACS Surgical Oncology, General Surgery, Trauma and Critical [facility], [place] [phone] for weekday/non holidays Check [email or web address] for coverage night/weekend/holidays  Do not use SecureChat as it is not reliable for timely patient care.

## 2020-09-07 LAB — BASIC METABOLIC PANEL
Anion gap: 9 (ref 5–15)
BUN: <5 mg/dL — ABNORMAL LOW (ref 6–20)
CO2: 27 mmol/L (ref 22–32)
Calcium: 8.7 mg/dL — ABNORMAL LOW (ref 8.9–10.3)
Chloride: 105 mmol/L (ref 98–111)
Creatinine, Ser: 0.62 mg/dL (ref 0.61–1.24)
GFR, Estimated: >60 mL/min (ref >60)
Glucose, Bld: 133 mg/dL — ABNORMAL HIGH (ref 70–99)
Potassium: 3.6 mmol/L (ref 3.5–5.1)
Sodium: 141 mmol/L (ref 135–145)

## 2020-09-07 LAB — URINE CULTURE: Culture: 6000 — AB

## 2020-09-07 LAB — CBC
HCT: 36.6 % — ABNORMAL LOW (ref 39.0–52.0)
Hemoglobin: 12.6 g/dL — ABNORMAL LOW (ref 13.0–17.0)
MCH: 30.8 pg (ref 26.0–34.0)
MCHC: 34.4 g/dL (ref 30.0–36.0)
MCV: 89.5 fL (ref 80.0–100.0)
Platelets: 197 10*3/uL (ref 150–400)
RBC: 4.09 MIL/uL — ABNORMAL LOW (ref 4.22–5.81)
RDW: 13.6 % (ref 11.5–15.5)
WBC: 5.2 10*3/uL (ref 4.0–10.5)
nRBC: 0 % (ref 0.0–0.2)

## 2020-09-07 LAB — CULTURE, BLOOD (ROUTINE X 2)

## 2020-09-07 MED ORDER — METRONIDAZOLE 500 MG PO TABS
500.0000 mg | ORAL_TABLET | Freq: Three times a day (TID) | ORAL | Status: DC
  Administered 2020-09-07 – 2020-09-08 (×3): 500 mg via ORAL
  Filled 2020-09-07 (×3): qty 1

## 2020-09-07 MED ORDER — KCL IN DEXTROSE-NACL 20-5-0.45 MEQ/L-%-% IV SOLN
INTRAVENOUS | Status: DC
  Filled 2020-09-07: qty 1000

## 2020-09-07 MED ORDER — CIPROFLOXACIN HCL 500 MG PO TABS
500.0000 mg | ORAL_TABLET | Freq: Two times a day (BID) | ORAL | Status: DC
  Administered 2020-09-07 – 2020-09-08 (×2): 500 mg via ORAL
  Filled 2020-09-07 (×2): qty 1

## 2020-09-07 NOTE — Plan of Care (Signed)
  Problem: Clinical Measurements: Goal: Diagnostic test results will improve Outcome: Progressing Goal: Respiratory complications will improve Outcome: Progressing   Problem: Activity: Goal: Risk for activity intolerance will decrease Outcome: Progressing   Problem: Elimination: Goal: Will not experience complications related to bowel motility Outcome: Progressing Goal: Will not experience complications related to urinary retention Outcome: Progressing

## 2020-09-07 NOTE — Progress Notes (Signed)
[facility]  PROGRESS NOTE  Elijah Stanley [id number]:[id number] DOB: 24-Dec-1995 DOA: 09/04/2020 PCP: Truett Perna, MD   Brief HPI:   25 year old male with medical history of several palsy, hypothyroidism, was brought to ED with complaints of diarrhea.  Also had poor appetite.  He was found to have elevated heart rate at home.  Mother was concerned so she called PCP and it was recommended that he should be seen in the ED. At [facility], CT scan of the abdomen was obtained which showed enterocolitis versus SBO.  General surgery was consulted.    Subjective   Patient seen and examined, diarrhea has significantly improved.  Only had 1 BM this morning which was also semisolid.  Patient is alert, back to baseline.  He is nonverbal.  Tolerating full liquid diet well.   Assessment/Plan:     1. Enterocolitis-diarrhea has significantly improved with antibiotics.  We will switch to p.o. Cipro and Flagyl as patient has lost IV access.  Stool for C. difficile was obtained which was negative for C. difficile infection.  G-tube suction was discontinued.  Patient was started on clear liquid diet with thickener which tolerated well, diet was advanced to full liquid.  No vomiting.  Will change the diet to soft diet.   2. ?  SBO-General surgery has seen the patient and ruled out SBO.  Patient having liquid stools.  He has contrast in his colon from SB protocol film. 3. Hypokalemia-replete, potassium is 3.6 today. 4. Hypothyroidism-continue Synthroid 5. Anxiety-continue desvenlafaxine     COVID-19 Labs  No results for input(s): DDIMER, FERRITIN, LDH, CRP in the last 72 hours.  Lab Results  Component Value Date   SARSCOV2NAA NEGATIVE 09/04/2020     Scheduled medications:   . ciprofloxacin  500 mg Oral BID  . Desvenlafaxine Succinate ER  1 tablet Oral Daily  . enoxaparin (LOVENOX) injection  30 mg Subcutaneous Daily  . levothyroxine  50 mcg Oral Q0600  . metroNIDAZOLE  500 mg Oral Q8H          CBG: Recent Labs  Lab 09/05/20 1552 09/05/20 1628  GLUCAP 52* 173*    SpO2: 100 %    CBC: Recent Labs  Lab 09/04/20 1638 09/04/20 1734 09/05/20 1057 09/07/20 0559  WBC 11.6*  --  9.8 5.2  NEUTROABS 8.3*  --  6.2  --   HGB 16.1 15.6 14.1 12.6*  HCT 48.2 46.0 42.4 36.6*  MCV 89.6  --  90.4 89.5  PLT 215  --  158 197    Basic Metabolic Panel: Recent Labs  Lab 09/04/20 1734 09/04/20 1925 09/05/20 1405 09/06/20 2019 09/07/20 0559  NA 133* 138 137 141 141  K 5.9* 3.3* 5.1 3.2* 3.6  CL  --  112* 103 105 105  CO2  --  18* 18* 26 27  GLUCOSE  --  74 58* 99 133*  BUN  --  24* 24* 5* <5*  CREATININE  --  0.47* 0.96 0.46* 0.62  CALCIUM  --  6.1* 9.3 8.6* 8.7*  MG  --   --  1.9  --   --      Liver Function Tests: Recent Labs  Lab 09/04/20 1925 09/05/20 1405  AST 22 26  ALT 15 18  ALKPHOS 58 79  BILITOT 0.9 1.7*  PROT 4.7* 6.0*  ALBUMIN 2.7* 3.4*     Antibiotics: Anti-infectives (From admission, onward)   Start     Dose/Rate Route Frequency Ordered Stop   09/07/20 2000  ciprofloxacin (CIPRO) tablet 500 mg        500 mg Oral 2 times daily 09/07/20 1142     09/07/20 1400  metroNIDAZOLE (FLAGYL) tablet 500 mg        500 mg Oral Every 8 hours 09/07/20 1142     09/06/20 1700  metroNIDAZOLE (FLAGYL) IVPB 500 mg  Status:  Discontinued        500 mg 100 mL/hr over 60 Minutes Intravenous Every 8 hours 09/06/20 1453 09/07/20 1142   09/06/20 1600  ciprofloxacin (CIPRO) IVPB 400 mg  Status:  Discontinued        400 mg 200 mL/hr over 60 Minutes Intravenous Every 12 hours 09/06/20 1453 09/07/20 1142       DVT prophylaxis: Lovenox  Code Status: Full code  Family Communication: Discussed with mother at bedside.   Consultants:  General surgery  Procedures:  None    Objective   Vitals:   09/06/20 0547 09/06/20 1215 09/06/20 2038 09/07/20 0615  BP: 139/89 (!) 146/96 (!) 145/94 (!) 153/97  Pulse: 93 86 80 97  Resp:  16 18 19   Temp: 97.6 F (36.4  C)  97.6 F (36.4 C) 97.8 F (36.6 C)  TempSrc:      SpO2: 95%  100% 100%  Weight:      Height:        Intake/Output Summary (Last 24 hours) at 09/07/2020 1205 Last data filed at 09/07/2020 0400 Gross per 24 hour  Intake 1855.21 ml  Output --  Net 1855.21 ml    01/22 1901 - 01/24 0700 In: 2972.6 [P.O.:320; I.V.:2234.2] Out: -   Filed Weights   09/04/20 1557  Weight: 37.2 kg    Physical Examination:   General-appears in no acute distress  Heart-S1-S2, regular, no murmur auscultated  Lungs-clear to auscultation bilaterally, no wheezing or crackles auscultated  Abdomen-soft, nontender, no organomegaly  Extremities-no edema in the lower extremities  Neuro-alert, oriented x3, no focal deficit noted   Status is: Inpatient  Dispo: The patient is from: Home              Anticipated d/c is to: Home              Anticipated d/c date is: 09/08/2020              Patient currently not medically stable for discharge  Barrier to discharge-ongoing treatment for enterocolitis       Data Reviewed:   Recent Results (from the past 240 hour(s))  SARS Coronavirus 2 by RT PCR (hospital order, performed in [facility] hospital lab) Nasopharyngeal Nasopharyngeal Swab     Status: None   Collection Time: 09/04/20  4:38 PM   Specimen: Nasopharyngeal Swab  Result Value Ref Range Status   SARS Coronavirus 2 NEGATIVE NEGATIVE Final    Comment: (NOTE) SARS-CoV-2 target nucleic acids are NOT DETECTED.  The SARS-CoV-2 RNA is generally detectable in upper and lower respiratory specimens during the acute phase of infection. The lowest concentration of SARS-CoV-2 viral copies this assay can detect is 250 copies / mL. A negative result does not preclude SARS-CoV-2 infection and should not be used as the sole basis for treatment or other patient management decisions.  A negative result may occur with improper specimen collection / handling, submission of specimen other than  nasopharyngeal swab, presence of viral mutation(s) within the areas targeted by this assay, and inadequate number of viral copies (<250 copies / mL). A negative result must be combined with clinical  observations, patient history, and epidemiological information.  Fact Sheet for Patients:   BoilerBrush.com.cy  Fact Sheet for Healthcare Providers: https://pope.com/  This test is not yet approved or  cleared by the [place] FDA and has been authorized for detection and/or diagnosis of SARS-CoV-2 by FDA under an Emergency Use Authorization (EUA).  This EUA will remain in effect (meaning this test can be used) for the duration of the COVID-19 declaration under Section 564(b)(1) of the Act, 21 U.S.C. section 360bbb-3(b)(1), unless the authorization is terminated or revoked sooner.  Performed at [facility], [address] [place], [place] [address]   Blood culture (routine x 2)     Status: None (Preliminary result)   Collection Time: 09/04/20  4:40 PM   Specimen: BLOOD  Result Value Ref Range Status   Specimen Description   Final    BLOOD RIGHT HAND Performed at [facility], [address] [place], [place] [address]    Special Requests   Final    BOTTLES DRAWN AEROBIC AND ANAEROBIC Blood Culture results may not be optimal due to an inadequate volume of blood received in culture bottles Performed at [facility], [address] [place], [place] [address]    Culture   Final    NO GROWTH 1 DAY Performed at [facility], [address] [place], [place] [address]    Report Status PENDING  Incomplete  Urine Culture     Status: Abnormal   Collection Time: 09/05/20  3:54 AM   Specimen: Urine, Random  Result Value Ref Range Status   Specimen Description   Final    URINE, RANDOM Performed at [facility], [address] [place], [place] [address]    Special Requests    Final    NONE Performed at [facility], [address], [place], [place] [address]    Culture (A)  Final    6,000 COLONIES/mL STREPTOCOCCUS AGALACTIAE TESTING AGAINST S. AGALACTIAE NOT ROUTINELY PERFORMED DUE TO PREDICTABILITY OF AMP/PEN/VAN SUSCEPTIBILITY. Performed at [facility], [address] [place], [place] [address]    Report Status 09/07/2020 FINAL  Final  Culture, blood (single)     Status: None (Preliminary result)   Collection Time: 09/05/20  7:12 AM   Specimen: BLOOD  Result Value Ref Range Status   Specimen Description   Final    BLOOD RIGHT ANTECUBITAL Performed at [facility], [address], [place], [place] [address]    Special Requests   Final    BOTTLES DRAWN AEROBIC ONLY Blood Culture adequate volume Performed at [facility], [address] [place], [place] [address]    Culture   Final    NO GROWTH 1 DAY Performed at [facility], [address], [place], [place] [address]    Report Status PENDING  Incomplete  C Difficile Quick Screen w PCR reflex     Status: None   Collection Time: 09/06/20  9:13 AM   Specimen: STOOL  Result Value Ref Range Status   C Diff antigen NEGATIVE NEGATIVE Final   C Diff toxin NEGATIVE NEGATIVE Final   C Diff interpretation No C. difficile detected.  Final    Comment: Performed at [facility], [address], [place], [place] [address]    No results for input(s): LIPASE, AMYLASE in the last 168 hours. No results for input(s): AMMONIA in the last 168  hours.  Cardiac Enzymes: No results for input(s): CKTOTAL, CKMB, CKMBINDEX, TROPONINI in the last 168 hours. BNP (last 3 results) No results for input(s): BNP in the last 8760 hours.  ProBNP (last 3 results) No results for input(s): PROBNP in the last 8760 hours.  Studies:  DG Abd Portable 1V-Small Bowel Obstruction Protocol-initial, 8 hr delay  Result Date: 09/05/2020 CLINICAL  DATA:  Small bowel obstruction with 8 hour delay imaging obtained. EXAM: PORTABLE ABDOMEN - 1 VIEW COMPARISON:  CT abdomen and pelvis 09/04/2020 FINDINGS: Contrast material is seen within dilated small bowel and colon suggesting adynamic ileus versus partial small bowel obstruction. A gastrostomy tube is demonstrated in the left mid abdomen. IMPRESSION: Contrast material throughout dilated small bowel and colon likely indicating adynamic ileus although partial small bowel obstruction could have this appearance. Electronically Signed   By: Burman NievesWilliam  Stevens M.D.   On: 09/05/2020 21:16       Hollie Bartus S Demara Lover   [facility] If 7PM-7AM, please contact night-coverage at [email or web address], Office  [phone]   09/07/2020, 12:05 PM  LOS: 2 days

## 2020-09-08 DIAGNOSIS — E876 Hypokalemia: Secondary | ICD-10-CM

## 2020-09-08 LAB — BASIC METABOLIC PANEL
Anion gap: 11 (ref 5–15)
BUN: <5 mg/dL — ABNORMAL LOW (ref 6–20)
CO2: 26 mmol/L (ref 22–32)
Calcium: 9.1 mg/dL (ref 8.9–10.3)
Chloride: 104 mmol/L (ref 98–111)
Creatinine, Ser: 0.67 mg/dL (ref 0.61–1.24)
GFR, Estimated: >60 mL/min (ref >60)
Glucose, Bld: 93 mg/dL (ref 70–99)
Potassium: 4.2 mmol/L (ref 3.5–5.1)
Sodium: 141 mmol/L (ref 135–145)

## 2020-09-08 MED ORDER — METRONIDAZOLE 500 MG PO TABS: 500.0000 mg | ORAL_TABLET | Freq: Three times a day (TID) | ORAL | 0 refills | Status: AC

## 2020-09-08 MED ORDER — CIPROFLOXACIN HCL 250 MG PO TABS: 250.0000 mg | ORAL_TABLET | Freq: Two times a day (BID) | ORAL | 0 refills | Status: AC

## 2020-09-08 NOTE — Discharge Summary (Signed)
Physician Discharge Summary  Elijah JumboMatthew J Stanley [id number]:[id number] DOB: 10-Jun-1996 DOA: 09/04/2020  PCP: Truett PernaWang, Yun, MD  Admit date: 09/04/2020 Discharge date: 09/08/2020  Time spent: 50 minutes  Recommendations for Outpatient Follow-up:  1. Follow-up PCP in 1 week   Discharge Diagnoses:  Active Problems:   Enterocolitis   Discharge Condition: Stable  Diet recommendation: Regular diet  Filed Weights   09/04/20 1557  Weight: 37.2 kg    History of present illness:  25 year old male with medical history of several palsy, hypothyroidism, was brought to ED with complaints of diarrhea.  Also had poor appetite.  He was found to have elevated heart rate at home.  Mother was concerned so she called PCP and it was recommended that he should be seen in the ED. At [facility], CT scan of the abdomen was obtained which showed enterocolitis versus SBO.  General surgery was consulted.   Hospital Course:   1. Enterocolitis-resolved, patient was admitted with diarrhea.  Diarrhea has resolved.  Stool for C. difficile PCR obtained was negative for C. Difficile.  G-tube suction was discontinued.  Patient was started on clear liquid diet with thickener, and tolerated well.  .    Which has been advanced to regular diet and his tolerating diet well.  Patient was started on Cipro and Flagyl.  He has received 2 days of Cipro and Flagyl.  Will discharge for 3 more days of antibiotics.   2. ?  SBO-General surgery has seen the patient and ruled out SBO.  Patient was having  liquid stools.  He has contrast in his colon from SB protocol film. 3. Hypokalemia-replete 4. Hypothyroidism-continue Synthroid   Procedures:  None  Consultations:  General surgery  Discharge Exam: Vitals:   09/07/20 1409 09/07/20 2101  BP: (!) 136/92 (!) 152/79  Pulse: 89 73  Resp: 19 18  Temp: (!) 97.2 F (36.2 C) 97.6 F (36.4 C)  SpO2: 98% 99%    General: Appears in no acute distress Cardiovascular: S1-S2,  regular Respiratory: Clear to auscultation bilaterally Abdomen-soft, nontender, no organomegaly  Discharge Instructions   Discharge Instructions    Diet - low sodium heart healthy   Complete by: As directed    Increase activity slowly   Complete by: As directed      Allergies as of 09/08/2020      Reactions   Rocephin [ceftriaxone Sodium In Dextrose] Rash      Medication List    TAKE these medications   ciprofloxacin 250 MG tablet Commonly known as: CIPRO Take 1 tablet (250 mg total) by mouth 2 (two) times daily for 3 days.   Desvenlafaxine Succinate ER 25 MG Tb24 Take 1 tablet by mouth daily.   levothyroxine 50 MCG tablet Commonly known as: SYNTHROID Take 50 mcg by mouth daily before breakfast.   metroNIDAZOLE 500 MG tablet Commonly known as: FLAGYL Take 1 tablet (500 mg total) by mouth every 8 (eight) hours for 3 days.   multivitamin Chew chewable tablet Chew 1 tablet by mouth daily.   polyethylene glycol 17 g packet Commonly known as: MIRALAX / GLYCOLAX Take 17 g by mouth daily.   PRILOSEC PO Take 20 mg by mouth as needed (heartburn). OTC      Allergies  Allergen Reactions  . Rocephin [Ceftriaxone Sodium In Dextrose] Rash      The results of significant diagnostics from this hospitalization (including imaging, microbiology, ancillary and laboratory) are listed below for reference.    Significant Diagnostic Studies: CT ABDOMEN PELVIS WO  CONTRAST  Result Date: 09/04/2020 CLINICAL DATA:  Abdominal pain and fever.  Diarrhea yesterday. EXAM: CT ABDOMEN AND PELVIS WITHOUT CONTRAST TECHNIQUE: Multidetector CT imaging of the abdomen and pelvis was performed following the standard protocol without IV contrast. COMPARISON:  None. FINDINGS: Lower chest: The lung bases are clear. Dilated esophagus with air-fluid level. Incompletely evaluated. This could represent distal obstruction due to mass or stricture or postoperative change versus achalasia. Surgical clips at  the EG junction. Hepatobiliary: No focal liver abnormality is seen. No gallstones, gallbladder wall thickening, or biliary dilatation. Pancreas: Unremarkable. No pancreatic ductal dilatation or surrounding inflammatory changes. Spleen: Normal in size without focal abnormality. Adrenals/Urinary Tract: Adrenal glands are unremarkable. Kidneys are normal, without renal calculi, focal lesion, or hydronephrosis. Bladder is unremarkable. Stomach/Bowel: Gastrostomy tube inserted along the lower greater curvature of the stomach. Retention balloon is within the stomach. Stomach is mostly decompressed. The colon is filled with solid and liquid stool. Small bowel and colon are moderately distended with air-fluid levels in the small bowel. Some small bowel wall thickening is demonstrated. Changes probably represent enterocolitis but associated small-bowel obstruction is not excluded. Mesenteric lymph nodes are prominent, likely reactive or inflammatory. Vascular/Lymphatic: Normal caliber abdominal aorta. No significant retroperitoneal lymphadenopathy. Reproductive: Prostate gland is not enlarged. Other: No free air or free fluid demonstrated in the abdomen. Musculoskeletal: No acute bony abnormalities. IMPRESSION: 1. Dilated esophagus with air-fluid level. This could represent distal obstruction due to mass or stricture or postoperative change versus achalasia. 2. Gastrostomy tube inserted along the lower greater curvature of the stomach. Retention balloon is within the stomach. 3. Small bowel and colon are moderately distended with air-fluid levels in the small bowel. Some small bowel wall thickening is demonstrated. Changes probably represent enterocolitis but associated small-bowel obstruction is also a possibility. 4. Mesenteric lymph nodes are prominent, likely reactive or inflammatory. Electronically Signed   By: Burman Nieves M.D.   On: 09/04/2020 22:39   DG Chest 1 View  Result Date: 09/04/2020 CLINICAL DATA:   Fever, diarrhea, cerebral palsy EXAM: CHEST  1 VIEW COMPARISON:  None. FINDINGS: Lung volumes are small, but are symmetric and are clear. No pneumothorax or pleural effusion. Gas seen below the right hemidiaphragm appears encapsulated and likely represents gas within the hepatic flexure of the colon. Cardiac size within normal limits. Surgical clips seen in the region of the aortic arch in keeping with given history of coarctation repair. Pulmonary vascularity is normal. No acute bone abnormality. IMPRESSION: No active disease. Gas beneath the right hemidiaphragm likely within the hepatic flexure of the colon. Electronically Signed   By: Helyn Numbers MD   On: 09/04/2020 22:38   DG Abd Portable 1V-Small Bowel Obstruction Protocol-initial, 8 hr delay  Result Date: 09/05/2020 CLINICAL DATA:  Small bowel obstruction with 8 hour delay imaging obtained. EXAM: PORTABLE ABDOMEN - 1 VIEW COMPARISON:  CT abdomen and pelvis 09/04/2020 FINDINGS: Contrast material is seen within dilated small bowel and colon suggesting adynamic ileus versus partial small bowel obstruction. A gastrostomy tube is demonstrated in the left mid abdomen. IMPRESSION: Contrast material throughout dilated small bowel and colon likely indicating adynamic ileus although partial small bowel obstruction could have this appearance. Electronically Signed   By: Burman Nieves M.D.   On: 09/05/2020 21:16    Microbiology: Recent Results (from the past 240 hour(s))  SARS Coronavirus 2 by RT PCR (hospital order, performed in [facility] hospital lab) Nasopharyngeal Nasopharyngeal Swab     Status: None   Collection Time:  09/04/20  4:38 PM   Specimen: Nasopharyngeal Swab  Result Value Ref Range Status   SARS Coronavirus 2 NEGATIVE NEGATIVE Final    Comment: (NOTE) SARS-CoV-2 target nucleic acids are NOT DETECTED.  The SARS-CoV-2 RNA is generally detectable in upper and lower respiratory specimens during the acute phase of infection. The  lowest concentration of SARS-CoV-2 viral copies this assay can detect is 250 copies / mL. A negative result does not preclude SARS-CoV-2 infection and should not be used as the sole basis for treatment or other patient management decisions.  A negative result may occur with improper specimen collection / handling, submission of specimen other than nasopharyngeal swab, presence of viral mutation(s) within the areas targeted by this assay, and inadequate number of viral copies (<250 copies / mL). A negative result must be combined with clinical observations, patient history, and epidemiological information.  Fact Sheet for Patients:   BoilerBrush.com.cy  Fact Sheet for Healthcare Providers: https://pope.com/  This test is not yet approved or  cleared by the [place] FDA and has been authorized for detection and/or diagnosis of SARS-CoV-2 by FDA under an Emergency Use Authorization (EUA).  This EUA will remain in effect (meaning this test can be used) for the duration of the COVID-19 declaration under Section 564(b)(1) of the Act, 21 U.S.C. section 360bbb-3(b)(1), unless the authorization is terminated or revoked sooner.  Performed at [facility], [address] [place], [place] [address]   Blood culture (routine x 2)     Status: None (Preliminary result)   Collection Time: 09/04/20  4:40 PM   Specimen: BLOOD  Result Value Ref Range Status   Specimen Description   Final    BLOOD RIGHT HAND Performed at [facility], [address] [place], [place] [address]    Special Requests   Final    BOTTLES DRAWN AEROBIC AND ANAEROBIC Blood Culture results may not be optimal due to an inadequate volume of blood received in culture bottles Performed at [facility], [address] [place], [place] [address]    Culture   Final    NO GROWTH 2 DAYS Performed at [facility], [address],  [place], [place] [address]    Report Status PENDING  Incomplete  Urine Culture     Status: Abnormal   Collection Time: 09/05/20  3:54 AM   Specimen: Urine, Random  Result Value Ref Range Status   Specimen Description   Final    URINE, RANDOM Performed at [facility], [address], [place], [place] [address]    Special Requests   Final    NONE Performed at [facility], [address] [place], [place] [address]    Culture (A)  Final    6,000 COLONIES/mL STREPTOCOCCUS AGALACTIAE TESTING AGAINST S. AGALACTIAE NOT ROUTINELY PERFORMED DUE TO PREDICTABILITY OF AMP/PEN/VAN SUSCEPTIBILITY. Performed at [facility], [address] [place], [place] [address]    Report Status 09/07/2020 FINAL  Final  Culture, blood (single)     Status: None (Preliminary result)   Collection Time: 09/05/20  7:12 AM   Specimen: BLOOD  Result Value Ref Range Status   Specimen Description   Final    BLOOD RIGHT ANTECUBITAL Performed at [facility], [address] [place], [place] [address]    Special Requests   Final    BOTTLES DRAWN AEROBIC ONLY Blood Culture adequate volume Performed at [facility], [address], [place], [place] [address]    Culture   Final    NO GROWTH 2 DAYS Performed at [facility], [address] [place], [place] [address]    Report Status PENDING  Incomplete  C Difficile Quick Screen w PCR reflex     Status: None   Collection Time: 09/06/20  9:13 AM   Specimen: STOOL  Result Value Ref Range Status   C Diff antigen NEGATIVE NEGATIVE Final   C Diff toxin NEGATIVE NEGATIVE Final   C Diff interpretation No C. difficile detected.  Final    Comment: Performed at [facility], [address], [place], [place] [address]     Labs: Basic Metabolic Panel: Recent Labs  Lab 09/04/20 1925 09/05/20 1405 09/06/20 2019 09/07/20 0559 09/08/20 0548  NA 138 137 141 141 141   K 3.3* 5.1 3.2* 3.6 4.2  CL 112* 103 105 105 104  CO2 18* 18* 26 27 26   GLUCOSE 74 58* 99 133* 93  BUN 24* 24* 5* <5* <5*  CREATININE 0.47* 0.96 0.46* 0.62 0.67  CALCIUM 6.1* 9.3 8.6* 8.7* 9.1  MG  --  1.9  --   --   --    Liver Function Tests: Recent Labs  Lab 09/04/20 1925 09/05/20 1405  AST 22 26  ALT 15 18  ALKPHOS 58 79  BILITOT 0.9 1.7*  PROT 4.7* 6.0*  ALBUMIN 2.7* 3.4*   No results for input(s): LIPASE, AMYLASE in the last 168 hours. No results for input(s): AMMONIA in the last 168 hours. CBC: Recent Labs  Lab 09/04/20 1638 09/04/20 1734 09/05/20 1057 09/07/20 0559  WBC 11.6*  --  9.8 5.2  NEUTROABS 8.3*  --  6.2  --   HGB 16.1 15.6 14.1 12.6*  HCT 48.2 46.0 42.4 36.6*  MCV 89.6  --  90.4 89.5  PLT 215  --  158 197    CBG: Recent Labs  Lab 09/05/20 1552 09/05/20 1628  GLUCAP 52* 173*       Signed:  09/07/20 MD.  [facility] 09/08/2020, 8:49 AM

## 2020-09-10 LAB — CULTURE, BLOOD (SINGLE)
Culture: NO GROWTH
Special Requests: ADEQUATE

## 2020-09-10 LAB — CULTURE, BLOOD (ROUTINE X 2): Culture: NO GROWTH

## 2020-12-15 DIAGNOSIS — I10 Essential (primary) hypertension: Secondary | ICD-10-CM | POA: Diagnosis present

## 2021-12-09 ENCOUNTER — Emergency Department (HOSPITAL_BASED_OUTPATIENT_CLINIC_OR_DEPARTMENT_OTHER): Payer: Medicaid Other

## 2021-12-09 ENCOUNTER — Inpatient Hospital Stay (HOSPITAL_BASED_OUTPATIENT_CLINIC_OR_DEPARTMENT_OTHER)
Admission: EM | Admit: 2021-12-09 | Discharge: 2021-12-14 | DRG: 391 | Disposition: A | Discharge status: Home / Self Care | Payer: Medicaid Other | Attending: Family Medicine | Admitting: Family Medicine

## 2021-12-09 ENCOUNTER — Encounter (HOSPITAL_BASED_OUTPATIENT_CLINIC_OR_DEPARTMENT_OTHER): Payer: Self-pay | Admitting: Emergency Medicine

## 2021-12-09 ENCOUNTER — Other Ambulatory Visit: Payer: Self-pay

## 2021-12-09 DIAGNOSIS — G808 Other cerebral palsy: Secondary | ICD-10-CM | POA: Diagnosis present

## 2021-12-09 DIAGNOSIS — Z931 Gastrostomy status: Secondary | ICD-10-CM

## 2021-12-09 DIAGNOSIS — E876 Hypokalemia: Secondary | ICD-10-CM | POA: Diagnosis present

## 2021-12-09 DIAGNOSIS — K560 Paralytic ileus: Secondary | ICD-10-CM | POA: Diagnosis present

## 2021-12-09 DIAGNOSIS — A09 Infectious gastroenteritis and colitis, unspecified: Principal | ICD-10-CM | POA: Diagnosis present

## 2021-12-09 DIAGNOSIS — E861 Hypovolemia: Secondary | ICD-10-CM | POA: Diagnosis present

## 2021-12-09 DIAGNOSIS — H9190 Unspecified hearing loss, unspecified ear: Secondary | ICD-10-CM | POA: Diagnosis present

## 2021-12-09 DIAGNOSIS — Q040 Congenital malformations of corpus callosum: Secondary | ICD-10-CM | POA: Diagnosis not present

## 2021-12-09 DIAGNOSIS — I10 Essential (primary) hypertension: Secondary | ICD-10-CM | POA: Diagnosis present

## 2021-12-09 DIAGNOSIS — K567 Ileus, unspecified: Secondary | ICD-10-CM | POA: Diagnosis present

## 2021-12-09 DIAGNOSIS — R6250 Unspecified lack of expected normal physiological development in childhood: Secondary | ICD-10-CM | POA: Diagnosis present

## 2021-12-09 DIAGNOSIS — Z79890 Hormone replacement therapy (postmenopausal): Secondary | ICD-10-CM | POA: Diagnosis not present

## 2021-12-09 DIAGNOSIS — K529 Noninfective gastroenteritis and colitis, unspecified: Secondary | ICD-10-CM | POA: Diagnosis present

## 2021-12-09 DIAGNOSIS — Z79899 Other long term (current) drug therapy: Secondary | ICD-10-CM

## 2021-12-09 DIAGNOSIS — E038 Other specified hypothyroidism: Secondary | ICD-10-CM | POA: Diagnosis present

## 2021-12-09 DIAGNOSIS — R000 Tachycardia, unspecified: Secondary | ICD-10-CM | POA: Diagnosis not present

## 2021-12-09 DIAGNOSIS — Z881 Allergy status to other antibiotic agents status: Secondary | ICD-10-CM

## 2021-12-09 DIAGNOSIS — F411 Generalized anxiety disorder: Secondary | ICD-10-CM | POA: Diagnosis present

## 2021-12-09 DIAGNOSIS — M8580 Other specified disorders of bone density and structure, unspecified site: Secondary | ICD-10-CM | POA: Diagnosis present

## 2021-12-09 DIAGNOSIS — E871 Hypo-osmolality and hyponatremia: Secondary | ICD-10-CM | POA: Diagnosis present

## 2021-12-09 DIAGNOSIS — K56600 Partial intestinal obstruction, unspecified as to cause: Secondary | ICD-10-CM | POA: Diagnosis present

## 2021-12-09 DIAGNOSIS — Z8379 Family history of other diseases of the digestive system: Secondary | ICD-10-CM

## 2021-12-09 DIAGNOSIS — R1312 Dysphagia, oropharyngeal phase: Secondary | ICD-10-CM | POA: Diagnosis not present

## 2021-12-09 DIAGNOSIS — Q9359 Other deletions of part of a chromosome: Secondary | ICD-10-CM | POA: Diagnosis not present

## 2021-12-09 DIAGNOSIS — M21962 Unspecified acquired deformity of left lower leg: Secondary | ICD-10-CM | POA: Diagnosis present

## 2021-12-09 LAB — CBC WITH DIFFERENTIAL/PLATELET
Abs Immature Granulocytes: 0.06 10*3/uL (ref 0.00–0.07)
Basophils Absolute: 0 10*3/uL (ref 0.0–0.1)
Basophils Relative: 0 %
Eosinophils Absolute: 0 10*3/uL (ref 0.0–0.5)
Eosinophils Relative: 0 %
HCT: 40.3 % (ref 39.0–52.0)
Hemoglobin: 14.4 g/dL (ref 13.0–17.0)
Immature Granulocytes: 1 %
Lymphocytes Relative: 13 %
Lymphs Abs: 1.4 10*3/uL (ref 0.7–4.0)
MCH: 30.6 pg (ref 26.0–34.0)
MCHC: 35.7 g/dL (ref 30.0–36.0)
MCV: 85.6 fL (ref 80.0–100.0)
Monocytes Absolute: 1.3 10*3/uL — ABNORMAL HIGH (ref 0.1–1.0)
Monocytes Relative: 12 %
Neutro Abs: 7.7 10*3/uL (ref 1.7–7.7)
Neutrophils Relative %: 74 %
Platelets: 217 10*3/uL (ref 150–400)
RBC: 4.71 MIL/uL (ref 4.22–5.81)
RDW: 13.3 % (ref 11.5–15.5)
WBC Morphology: INCREASED
WBC: 10.3 10*3/uL (ref 4.0–10.5)
nRBC: 0 % (ref 0.0–0.2)

## 2021-12-09 LAB — COMPREHENSIVE METABOLIC PANEL
ALT: 18 U/L (ref 0–44)
AST: 20 U/L (ref 15–41)
Albumin: 3.9 g/dL (ref 3.5–5.0)
Alkaline Phosphatase: 93 U/L (ref 38–126)
Anion gap: 15 (ref 5–15)
BUN: 52 mg/dL — ABNORMAL HIGH (ref 6–20)
CO2: 27 mmol/L (ref 22–32)
Calcium: 9.7 mg/dL (ref 8.9–10.3)
Chloride: 86 mmol/L — ABNORMAL LOW (ref 98–111)
Creatinine, Ser: 1.24 mg/dL (ref 0.61–1.24)
GFR, Estimated: >60 mL/min (ref >60)
Glucose, Bld: 95 mg/dL (ref 70–99)
Potassium: 3.8 mmol/L (ref 3.5–5.1)
Sodium: 128 mmol/L — ABNORMAL LOW (ref 135–145)
Total Bilirubin: 1 mg/dL (ref 0.3–1.2)
Total Protein: 6.7 g/dL (ref 6.5–8.1)

## 2021-12-09 LAB — LIPASE, BLOOD: Lipase: 15 U/L (ref 11–51)

## 2021-12-09 MED ORDER — SODIUM CHLORIDE 0.9 % IV SOLN
INTRAVENOUS | Status: AC
Start: 2021-12-09 — End: 2021-12-10

## 2021-12-09 MED ORDER — ENOXAPARIN SODIUM 300 MG/3ML IJ SOLN
20.0000 mg | INTRAMUSCULAR | Status: DC
  Administered 2021-12-09 – 2021-12-13 (×5): 20 mg via SUBCUTANEOUS
  Filled 2021-12-09 (×7): qty 0.2

## 2021-12-09 MED ORDER — ACETAMINOPHEN 650 MG RE SUPP: 650.0000 mg | Freq: Four times a day (QID) | RECTAL | Status: DC | PRN

## 2021-12-09 MED ORDER — KETOROLAC TROMETHAMINE 15 MG/ML IJ SOLN
15.0000 mg | Freq: Once | INTRAMUSCULAR | Status: AC
  Administered 2021-12-09: 15 mg via INTRAVENOUS
  Filled 2021-12-09: qty 1

## 2021-12-09 MED ORDER — IOHEXOL 300 MG/ML  SOLN
100.0000 mL | Freq: Once | INTRAMUSCULAR | Status: AC | PRN
  Administered 2021-12-09: 75 mL via INTRAVENOUS

## 2021-12-09 MED ORDER — ONDANSETRON HCL 4 MG PO TABS: 4.0000 mg | ORAL_TABLET | Freq: Four times a day (QID) | ORAL | Status: DC | PRN

## 2021-12-09 MED ORDER — SODIUM CHLORIDE 0.9 % IV BOLUS
1000.0000 mL | Freq: Once | INTRAVENOUS | Status: AC
Start: 2021-12-09 — End: 2021-12-09
  Administered 2021-12-09: 1000 mL via INTRAVENOUS

## 2021-12-09 MED ORDER — KETOROLAC TROMETHAMINE 15 MG/ML IJ SOLN: 15.0000 mg | Freq: Four times a day (QID) | INTRAMUSCULAR | Status: DC | PRN

## 2021-12-09 MED ORDER — ONDANSETRON HCL 4 MG/2ML IJ SOLN
4.0000 mg | Freq: Once | INTRAMUSCULAR | Status: AC
  Administered 2021-12-09: 4 mg via INTRAVENOUS
  Filled 2021-12-09: qty 2

## 2021-12-09 MED ORDER — ACETAMINOPHEN 325 MG PO TABS
650.0000 mg | ORAL_TABLET | Freq: Four times a day (QID) | ORAL | Status: DC | PRN
Start: 2021-12-09 — End: 2021-12-14
  Administered 2021-12-13 – 2021-12-14 (×2): 650 mg via ORAL
  Filled 2021-12-09 (×2): qty 2

## 2021-12-09 MED ORDER — ONDANSETRON HCL 4 MG/2ML IJ SOLN: 4.0000 mg | Freq: Four times a day (QID) | INTRAMUSCULAR | Status: DC | PRN

## 2021-12-09 NOTE — ED Notes (Signed)
Patient transported to X-ray 

## 2021-12-09 NOTE — ED Notes (Signed)
Looked for IV access with ultrasound machine. Poor vasculature. Pearline Cables MD notified. ?

## 2021-12-09 NOTE — Assessment & Plan Note (Signed)
Chronic issue, stable.  No evidence of acute fracture/abnormality on left lower extremity x-rays. ?

## 2021-12-09 NOTE — ED Notes (Signed)
[facility] at bedside 

## 2021-12-09 NOTE — Assessment & Plan Note (Signed)
Hold home Synthroid while NPO. ?

## 2021-12-09 NOTE — H&P (Signed)
?History and Physical  ? ? ?Elijah Stanley GOT:[id number] DOB: 1996-07-07 DOA: 12/09/2021 ? ?PCP: Truett Perna, MD  ?Patient coming from: [facility] ED ? ?I have personally briefly reviewed patient's old medical records in [facility] ? ?Chief Complaint: Vomiting ? ?HPI: ?Elijah Stanley is a 26 y.o. male with medical history significant for agenesis of corpus callosum, chromosome 1 deletion, developmental delay, left-sided hemiplegic cerebral palsy, central hypothyroidism, oropharyngeal dysphagia s/p G-tube, HTN, GAD, hearing loss and nonverbal status who presented to the ED for evaluation of vomiting and poor oral intake.  Patient is unable to provide history and is otherwise obtained by chart review and patient's mother at bedside. ? ?Patient has chronic oropharyngeal dysphagia with NG tube in place.  His mother states that he typically eats a soft diet by mouth and is given thin liquids through his G-tube.  He has required chronic use of MiraLAX to maintain regular bowel movements. ? ?2 days ago family noted that he appeared to be in apparent pain but was unable to indicate where his pain is located.  He then developed diarrhea followed by emesis.  They tried to keep up with his hydration by delivering fluids through his G-tube however again would develop diarrhea soon after. ? 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 ? ?[facility] ED Course  Labs/Imaging on admission: I have personally reviewed following labs and imaging studies. ? ?Initial vitals showed BP 130/90, pulse 122, RR 21, temp 98.0 ?F, SPO2 97% on room air. ? ?Labs show sodium 128, potassium 3.8, chloride 86, bicarb 27, BUN 52, creatinine 1.24, serum glucose 95, LFTs within normal limits, WBC 10.3, hemoglobin 14.4, platelets 217,000, lipase 15. ? ?Left femur x-ray negative for acute findings.  Atrophic thigh musculature noted.  Left tibia/fibula x-ray  negative for fracture or bony lesions.  Osteopenia noted.  Left foot x-ray showed substantial deformity of the foot including severe pes cavus and talonavicular varus without fracture. ? ?Scrotal ultrasound showed right orchiectomy without abnormality of the left testicle. ? ?CT abdomen/pelvis with contrast showed wall thickening and mucosal hyperenhancement diffusely throughout small bowel and within the rectum.  Findings favored to represent enterocolitis.  Low-grade partial SBO versus adynamic ileus also noted.  Chronic dilated distal esophagus with fluid within suggestive of dysmotility and/or Cresto esophageal reflux reported. ? ?Patient was given 1 L normal saline, IV Toradol, IV Zofran.  EDP initially discussed with on-call general surgery who recommended GI consult.  [facility], Dr. Levora Angel, was consulted and recommended medical admission and they will see in the morning.  The hospitalist service was consulted to admit for further evaluation and management. ? ?Review of Systems:  ?Unable to obtain full review of systems due to nonverbal status ? ? ?Past Medical History:  ?Diagnosis Date  ? Cerebral palsy (HCC)   ? Chromosome 1 deletion syndrome   ? Hearing loss   ? bilateral  ? Milroy lymphedema   ? Thyroid disease   ? hypothyroidism  ? Unilateral weakness   ? Left  ? ? ?Past Surgical History:  ?Procedure Laterality Date  ? COARCTATION OF AORTA REPAIR    ? GASTROSTOMY TUBE PLACEMENT    ? HERNIA REPAIR    ? ? ?Social History: ? reports that he has never smoked. He has never used smokeless tobacco. He reports that he does not drink alcohol and does not use drugs. ? ?Allergies  ?Allergen Reactions  ?  Rocephin [Ceftriaxone Sodium In Dextrose] Rash  ? ? ?History reviewed. No pertinent family history. ? ? ?Prior to Admission medications   ?Medication Sig Start Date End Date Taking? Authorizing Provider  ?albuterol (PROVENTIL) (2.5 MG/3ML) 0.083% nebulizer solution Inhale 3 mLs into the lungs as needed for  wheezing or shortness of breath. 09/28/20  Yes [provider]  ?carbamide peroxide (DEBROX) 6.5 % OTIC solution Place 5 drops into both ears as needed. For ear wax 04/09/21  Yes [provider]  ?Desvenlafaxine Succinate ER 25 MG TB24 Take 1 tablet by mouth daily. 08/25/20  Yes [provider]  ?levothyroxine (SYNTHROID, LEVOTHROID) 50 MCG tablet Take 50 mcg by mouth daily before breakfast. 25 mcg on Saturdays and Sundays   Yes [provider]  ?metoprolol tartrate (LOPRESSOR) 25 MG tablet Take 12.5 mg by mouth 2 (two) times daily. 09/13/21  Yes [provider]  ?multivitamin (VIT W/EXTRA C) CHEW chewable tablet Chew 1 tablet by mouth daily.   Yes [provider]  ?nystatin (MYCOSTATIN/NYSTOP) powder Apply a small amount topically to affected area 2-3x daily as needed for G-Tube site irritation 09/10/21  Yes [provider]  ?OXYGEN Concentrator to keep O2 sats > 92% as needed   Yes [provider]  ?polyethylene glycol (MIRALAX / GLYCOLAX) packet Take 17 g by mouth daily.   Yes [provider]  ?triamcinolone ointment (KENALOG) 0.1 % Apply a small amount to affected area 2-3 times daily as needed around G-tube. 09/10/21  Yes [provider]  ? ? ?Physical Exam: ?Vitals:  ? 12/09/21 1600 12/09/21 1730 12/09/21 1750 12/09/21 2006  ?BP: (!) 109/54 120/66 (!) 114/57 131/74  ?Pulse: (!) 111  (!) 120 (!) 121  ?Resp:   18 18  ?Temp:    97.8 ?F (36.6 ?C)  ?TempSrc:    Oral  ?SpO2: 95%  98% 100%  ?Weight:      ?Height:      ? ?Constitutional: Resting in bed, appears younger than actual age ?Eyes: PERRL ?ENMT: Mucous membranes are dry. Posterior pharynx clear of any exudate or lesions.micrognathia. ?Neck: normal, supple, no masses. ?Respiratory: clear to auscultation anteriorly. Normal respiratory effort. No accessory muscle use.  ?Cardiovascular: Tachycardic, no murmurs / rubs / gallops. No extremity edema.  ?Abdomen: G-tube in  place.appears to have at least mild discomfort to palpation.  Bowel sounds present.  ?Musculoskeletal: Thin extremities with muscle wasting.  Contracture to left hand, left leg, left foot. ?Skin: no rashes, lesions, ulcers. ?Neurologic: Sensation intact, strength appears equal bilaterally. ?Psychiatric: Awake and alert, nonverbal. ? ?EKG: Personally reviewed. Sinus tachycardia, rate 127, repolarization changes throughout.  Similar to prior. ? ?Assessment/Plan ?Principal Problem: ?  Enterocolitis ?Active Problems: ?  Ileus (HCC) ?  Hyponatremia ?  Central hypothyroidism ?  Generalized anxiety disorder ?  Hypertension ?  Left-sided hemiplegic cerebral palsy (HCC) ?  ?Lavonda JumboMatthew J Belles is a 26 y.o. male with medical history significant for agenesis of corpus callosum, chromosome 1 deletion, developmental delay, left-sided hemiplegic cerebral palsy, central hypothyroidism, oropharyngeal dysphagia s/p G-tube, HTN, GAD, hearing loss and nonverbal status who is admitted with enterocolitis. ? ?Assessment and Plan: ?* Enterocolitis ?Adynamic ileus versus partial SBO ?Findings of enterocolitis and adynamic ileus versus low-grade partial SBO seen on CT imaging.  Has chronic oropharyngeal dysphagia with G-tube in place to use as needed. ?-[facility] to see in a.m. ?-Keep n.p.o. ?-G-tube to gravity ?-Continue IV fluid hydration overnight ?-Antiemetics and analgesics as needed ? ?Hyponatremia ?Sodium 128 on  admission, likely secondary to hypovolemia from GI losses. ?-Continue IV fluid hydration overnight with NS@75  mL/hour ?-Repeat labs in a.m. ? ?Left-sided hemiplegic cerebral palsy (HCC) ?Chronic issue, stable.  No evidence of acute fracture/abnormality on left lower extremity x-rays. ? ?Hypertension ?Home Lopressor on hold while NPO. ? ?Generalized anxiety disorder ?Home Pristiq on hold while NPO. ? ?Central hypothyroidism ?Hold home Synthroid while NPO. ? ?DVT prophylaxis: Lovenox ?Code Status: Full code, confirmed on admission  with patient's mother/legal guardian. ?Family Communication: Discussed with patient's mother at bedside. ?Disposition Plan: From home and likely discharge to home pending clinical progress. ?Consults called: [facility]  ?Sev

## 2021-12-09 NOTE — ED Triage Notes (Addendum)
Patient presents to ED via POV from home. History of cerebral palsy. Non verbal. Deaf. Parent reports abdominal pain, nausea and vomiting that began yesterday. Feeding tube noted to LLQ.  ?

## 2021-12-09 NOTE — ED Provider Notes (Signed)
?[facility] EMERGENCY DEPARTMENT ?Provider Note ? ? ?CSN: [id number] ?Arrival date & time: 12/09/21  3546 ? ?  ? ?History ? ?Chief Complaint  ?Patient presents with  ? Abdominal Pain  ? ? ?Elijah Stanley is a 26 y.o. male. ? ?Patient is a 26 year old male with past medical history of chromosome 1 deletion, developmental delay, Milroy's disease, and cerebral palsy presenting for complaints of pain.  Patient is accompanied by his mom who is his legal guardian.  Mother states over the last 24 hours patient has been wincing and indicating that he is in pain whenever she moves them.  He is concerned that the location of the pain may be generating from the left leg.  Patient has also been having vomiting over the last 12 hours.  G-tube present.  Normally consumes by mouth for pleasure and G-tube for nutrition.  Patient is nonverbal and unable to verbally express pain occasion at this time.  ? ?The history is provided by the patient. No language interpreter was used.  ?Abdominal Pain ? ?  ? ?Home Medications ?Prior to Admission medications   ?Medication Sig Start Date End Date Taking? Authorizing Provider  ?Desvenlafaxine Succinate ER 25 MG TB24 Take 1 tablet by mouth daily. 08/25/20   [provider]  ?levothyroxine (SYNTHROID, LEVOTHROID) 50 MCG tablet Take 50 mcg by mouth daily before breakfast.    [provider]  ?multivitamin (VIT W/EXTRA C) CHEW chewable tablet Chew 1 tablet by mouth daily.    [provider]  ?Omeprazole (PRILOSEC PO) Take 20 mg by mouth as needed (heartburn). OTC    [provider]  ?polyethylene glycol (MIRALAX / GLYCOLAX) packet Take 17 g by mouth daily.    [provider]  ?   ? ?Allergies    ?Rocephin [ceftriaxone sodium in dextrose]   ? ?Review of Systems   ?Review of Systems  ?Unable to perform ROS: Patient nonverbal  ?Gastrointestinal:  Positive for abdominal pain.  ? ?Physical Exam ?Updated Vital Signs ?BP 119/71   Pulse (!) 123    Temp 98 ?F (36.7 ?C) (Tympanic)   Resp (!) 22   Ht 4' (1.219 m)   Wt 35.4 kg   SpO2 99%   BMI 23.80 kg/m?  ?Physical Exam ?Vitals and nursing note reviewed. Exam conducted with a chaperone present.  ?Constitutional:   ?   General: He is not in acute distress. ?   Appearance: He is well-developed.  ?HENT:  ?   Head: Normocephalic and atraumatic.  ?Eyes:  ?   Conjunctiva/sclera: Conjunctivae normal.  ?Cardiovascular:  ?   Rate and Rhythm: Normal rate and regular rhythm.  ?   Heart sounds: No murmur heard. ?Pulmonary:  ?   Effort: Pulmonary effort is normal. No respiratory distress.  ?   Breath sounds: Normal breath sounds.  ?Abdominal:  ?   Palpations: Abdomen is soft.  ?   Tenderness: There is no abdominal tenderness.  ?   Comments: No grimacing on palpation to abdomen  ?Genitourinary: ?   Testes:     ?   Right: Mass, tenderness or swelling not present.     ?   Left: Tenderness present. Mass or swelling not present.  ?   Comments: Yellow mucus from rectum ?Musculoskeletal:     ?   General: No swelling.  ?   Cervical back: Neck supple.  ?   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  ?Skin: ?   General: Skin is warm and dry.  ?   Capillary Refill: Capillary refill takes less than 2 seconds.  ?Neurological:  ?   Mental Status: He is alert.  ?Psychiatric:     ?   Mood and Affect: Mood normal.  ? ? ?ED Results / Procedures / Treatments   ?Labs ?(all labs ordered are listed, but only abnormal results are displayed) ?Labs Reviewed  ?COMPREHENSIVE METABOLIC PANEL - Abnormal; Notable for the following components:  ?    Result Value  ? Sodium 128 (*)   ? Chloride 86 (*)   ? BUN 52 (*)   ? All other components within normal limits  ?CBC WITH DIFFERENTIAL/PLATELET - Abnormal; Notable for the following components:  ? Monocytes Absolute 1.3 (*)   ? All other components within normal limits  ?LIPASE, BLOOD   ?URINALYSIS, ROUTINE W REFLEX MICROSCOPIC  ? ? ?EKG ?None ? ?Radiology ?DG Tibia/Fibula Left ? ?Result Date: 12/09/2021 ?CLINICAL DATA:  Patient with cerebral palsy, nonverbal with pain at the left testicle. His left lower extremity stays in fixed flexed position with inability to fully extend EXAM: LEFT TIBIA AND FIBULA - 2 VIEW COMPARISON:  No previous studies available for comparison FINDINGS: There is no evidence of fracture or other focal bone lesions. There is fixed plantar flexion of likely contracture seen at the ankle joint. Osteopenia. Moderate soft tissue swelling at the dorsum of the foot. IMPRESSION: No fracture or focal bony lesions. Osteopenia. Likely contracture in the plantar flexion at the ankle joint. Electronically Signed   By: Marjo Bicker M.D.   On: 12/09/2021 11:34  ? ?US Scrotum ? ?Result Date: 12/09/2021 ?CLINICAL DATA:  Left testicular pain EXAM: SCROTAL ULTRASOUND DOPPLER ULTRASOUND OF THE TESTICLES TECHNIQUE: Complete ultrasound examination of the testicles, epididymis, and other scrotal structures was performed. Color and spectral Doppler ultrasound were also utilized to evaluate blood flow to the testicles. COMPARISON:  None. FINDINGS: Right testicle Right orchiectomy Left testicle Measurements: 2.2 by 1.0 by 1.3 cm. No mass or microlithiasis visualized. Right epididymis:  Normal in size and appearance. Left epididymis:  Normal in size and appearance. Hydrocele:  None visualized. Varicocele:  None visualized. Pulsed Doppler interrogation of the left testicle demonstrates normal low resistance arterial and venous waveforms bilaterally. IMPRESSION: 1. No sonographic abnormality of the left testicle is identified. 2. Right orchectomy. Electronically Signed   By: Gaylyn Rong M.D.   On: 12/09/2021 11:40  ? ?CT ABDOMEN PELVIS W CONTRAST ? ?Result Date: 12/09/2021 ?CLINICAL DATA:  Nausea and vomiting. History of cerebral palsy. History of feeding tube. Hernia repair. EXAM: CT ABDOMEN AND  PELVIS WITH CONTRAST TECHNIQUE: Multidetector CT imaging of the abdomen and pelvis was performed using the standard protocol following bolus administration of intravenous contrast. RADIATION DOSE REDUCTION: This exam was performed according to the departmental dose-optimization program which includes automated exposure control, adjustment of the mA and/or kV according to patient size and/or use of iterative reconstruction technique. CONTRAST:  83mL OMNIPAQUE IOHEXOL 300 MG/ML  SOLN COMPARISON:  09/04/2020 CT. FINDINGS: Lower chest: Left greater than right base airspace and nodular opacities. Normal heart size without pericardial or pleural effusion. The esophagus is again dilated and fluid-filled with surgical changes at the gastroesophageal junction. Hepatobiliary: Degradation secondary to patient arm position and minimal motion. Normal liver. Mild gallbladder distension, without calcified stone or acute cholecystitis. Pancreas: Normal, without mass or ductal dilatation. Spleen: Normal in size, without focal abnormality. Adrenals/Urinary Tract: Normal  adrenal glands. Suspect mild renal cortical thinning for age. An interpolar left renal too small to characterize lesion at 6 mm is favored to represent a hemorrhagic cyst, given precontrast hyperattenuation on the prior exam. Normal urinary bladder. Stomach/Bowel: Gastrostomy tube appropriately position. The rectum is mildly thickened with mucosal hyperenhancement on 63/2. More proximal colon is relatively normal in caliber with portions appearing decompressed. Wall thickening and mucosal hyperenhancement are relatively mild, identified throughout the small bowel but most significant in the jejunum. Concurrent prominence of the Vasa recta. Example 42/2. No focal transition identified, with small bowel followed to the level of the ileocecal junction. Vascular/Lymphatic: Normal caliber of the aorta and branch vessels. No abdominopelvic adenopathy. Reproductive: Normal  prostate. Other: No significant free fluid. No pneumatosis or free intraperitoneal air. Musculoskeletal: No acute osseous abnormality. IMPRESSION: 1. Wall thickening and mucosal hyperenhancement relatively dif

## 2021-12-09 NOTE — Assessment & Plan Note (Signed)
Sodium 128 on admission, likely secondary to hypovolemia from GI losses. ?-Continue IV fluid hydration overnight with NS@75  mL/hour ?-Repeat labs in a.m. ?

## 2021-12-09 NOTE — Progress Notes (Signed)
Patient is a 26 year old male with chromosome 1 deletion,development delay,cerebral palsy,S/P G tube placement but also eats by mouth who was brought by his mom to [facility] ED because he was eating less,vomited twice and was felt to be in pain.On presentation,he was in sinus tach.Lab showed sodium of 128.CT abd/pelvis showed features of infectious/inflammatory enterocolitis,mildly dilated small bowel,likely adynamic ileu/low grade partial SBO.Case was discussed with Theressa Millard will follow the patient from tomorrow.Decision made to keep him NPO,start IV fluids,keep Gtube to gravity.Will be admitted for observation. ?

## 2021-12-09 NOTE — Assessment & Plan Note (Signed)
Home Lopressor on hold while NPO. ?

## 2021-12-09 NOTE — Assessment & Plan Note (Signed)
Home Pristiq on hold while NPO. ?

## 2021-12-09 NOTE — Hospital Course (Signed)
Elijah Stanley is a 26 y.o. male with medical history significant for agenesis of corpus callosum, chromosome 1 deletion, developmental delay, left-sided hemiplegic cerebral palsy, central hypothyroidism, oropharyngeal dysphagia s/p G-tube, HTN, GAD, hearing loss and nonverbal status who is admitted with enterocolitis. ?

## 2021-12-09 NOTE — ED Notes (Signed)
Wallace Cullens MD attempted IV [place] x1, unsuccessful. ?

## 2021-12-09 NOTE — Assessment & Plan Note (Addendum)
Adynamic ileus versus partial SBO ?Findings of enterocolitis and adynamic ileus versus low-grade partial SBO seen on CT imaging.  Has chronic oropharyngeal dysphagia with G-tube in place to use as needed. ?-[facility] to see in a.m. ?-Keep n.p.o. ?-G-tube to gravity ?-Continue IV fluid hydration overnight ?-Antiemetics and analgesics as needed ?

## 2021-12-09 NOTE — ED Notes (Signed)
Report called to RN on the floor and to [facility] .  ?

## 2021-12-10 ENCOUNTER — Observation Stay (HOSPITAL_COMMUNITY): Payer: Medicaid Other

## 2021-12-10 DIAGNOSIS — Z8379 Family history of other diseases of the digestive system: Secondary | ICD-10-CM | POA: Diagnosis not present

## 2021-12-10 DIAGNOSIS — R6250 Unspecified lack of expected normal physiological development in childhood: Secondary | ICD-10-CM | POA: Diagnosis present

## 2021-12-10 DIAGNOSIS — E876 Hypokalemia: Secondary | ICD-10-CM | POA: Diagnosis present

## 2021-12-10 DIAGNOSIS — A09 Infectious gastroenteritis and colitis, unspecified: Secondary | ICD-10-CM | POA: Diagnosis not present

## 2021-12-10 DIAGNOSIS — Z79890 Hormone replacement therapy (postmenopausal): Secondary | ICD-10-CM | POA: Diagnosis not present

## 2021-12-10 DIAGNOSIS — E038 Other specified hypothyroidism: Secondary | ICD-10-CM | POA: Diagnosis present

## 2021-12-10 DIAGNOSIS — H9190 Unspecified hearing loss, unspecified ear: Secondary | ICD-10-CM | POA: Diagnosis present

## 2021-12-10 DIAGNOSIS — Z931 Gastrostomy status: Secondary | ICD-10-CM | POA: Diagnosis not present

## 2021-12-10 DIAGNOSIS — M8580 Other specified disorders of bone density and structure, unspecified site: Secondary | ICD-10-CM | POA: Diagnosis present

## 2021-12-10 DIAGNOSIS — Z79899 Other long term (current) drug therapy: Secondary | ICD-10-CM | POA: Diagnosis not present

## 2021-12-10 DIAGNOSIS — K56600 Partial intestinal obstruction, unspecified as to cause: Secondary | ICD-10-CM | POA: Diagnosis present

## 2021-12-10 DIAGNOSIS — G808 Other cerebral palsy: Secondary | ICD-10-CM | POA: Diagnosis present

## 2021-12-10 DIAGNOSIS — E861 Hypovolemia: Secondary | ICD-10-CM | POA: Diagnosis present

## 2021-12-10 DIAGNOSIS — I10 Essential (primary) hypertension: Secondary | ICD-10-CM | POA: Diagnosis present

## 2021-12-10 DIAGNOSIS — R1312 Dysphagia, oropharyngeal phase: Secondary | ICD-10-CM | POA: Diagnosis present

## 2021-12-10 DIAGNOSIS — K560 Paralytic ileus: Secondary | ICD-10-CM | POA: Diagnosis present

## 2021-12-10 DIAGNOSIS — E871 Hypo-osmolality and hyponatremia: Secondary | ICD-10-CM | POA: Diagnosis present

## 2021-12-10 DIAGNOSIS — Q9359 Other deletions of part of a chromosome: Secondary | ICD-10-CM | POA: Diagnosis not present

## 2021-12-10 DIAGNOSIS — M21962 Unspecified acquired deformity of left lower leg: Secondary | ICD-10-CM | POA: Diagnosis present

## 2021-12-10 DIAGNOSIS — F411 Generalized anxiety disorder: Secondary | ICD-10-CM | POA: Diagnosis present

## 2021-12-10 DIAGNOSIS — Z881 Allergy status to other antibiotic agents status: Secondary | ICD-10-CM | POA: Diagnosis not present

## 2021-12-10 DIAGNOSIS — Q040 Congenital malformations of corpus callosum: Secondary | ICD-10-CM | POA: Diagnosis not present

## 2021-12-10 DIAGNOSIS — R000 Tachycardia, unspecified: Secondary | ICD-10-CM | POA: Diagnosis present

## 2021-12-10 DIAGNOSIS — K529 Noninfective gastroenteritis and colitis, unspecified: Secondary | ICD-10-CM | POA: Diagnosis not present

## 2021-12-10 LAB — COMPREHENSIVE METABOLIC PANEL
ALT: 18 U/L (ref 0–44)
AST: 19 U/L (ref 15–41)
Albumin: 3.1 g/dL — ABNORMAL LOW (ref 3.5–5.0)
Alkaline Phosphatase: 82 U/L (ref 38–126)
Anion gap: 13 (ref 5–15)
BUN: 36 mg/dL — ABNORMAL HIGH (ref 6–20)
CO2: 18 mmol/L — ABNORMAL LOW (ref 22–32)
Calcium: 8.8 mg/dL — ABNORMAL LOW (ref 8.9–10.3)
Chloride: 105 mmol/L (ref 98–111)
Creatinine, Ser: 0.91 mg/dL (ref 0.61–1.24)
GFR, Estimated: >60 mL/min (ref >60)
Glucose, Bld: 74 mg/dL (ref 70–99)
Potassium: 3.7 mmol/L (ref 3.5–5.1)
Sodium: 136 mmol/L (ref 135–145)
Total Bilirubin: 1.5 mg/dL — ABNORMAL HIGH (ref 0.3–1.2)
Total Protein: 5.7 g/dL — ABNORMAL LOW (ref 6.5–8.1)

## 2021-12-10 LAB — CBC
HCT: 39.5 % (ref 39.0–52.0)
Hemoglobin: 13.1 g/dL (ref 13.0–17.0)
MCH: 31.3 pg (ref 26.0–34.0)
MCHC: 33.2 g/dL (ref 30.0–36.0)
MCV: 94.5 fL (ref 80.0–100.0)
Platelets: 208 10*3/uL (ref 150–400)
RBC: 4.18 MIL/uL — ABNORMAL LOW (ref 4.22–5.81)
RDW: 14.1 % (ref 11.5–15.5)
WBC: 10.5 10*3/uL (ref 4.0–10.5)
nRBC: 0 % (ref 0.0–0.2)

## 2021-12-10 LAB — PROCALCITONIN: Procalcitonin: 2.42 ng/mL

## 2021-12-10 LAB — HIV ANTIBODY (ROUTINE TESTING W REFLEX): HIV Screen 4th Generation wRfx: NONREACTIVE

## 2021-12-10 MED ORDER — METOPROLOL TARTRATE 25 MG PO TABS
12.5000 mg | ORAL_TABLET | Freq: Two times a day (BID) | ORAL | Status: DC
  Administered 2021-12-10 – 2021-12-14 (×9): 12.5 mg via ORAL
  Filled 2021-12-10 (×9): qty 1

## 2021-12-10 MED ORDER — LEVOTHYROXINE SODIUM 50 MCG PO TABS
50.0000 ug | ORAL_TABLET | Freq: Every day | ORAL | Status: DC
  Administered 2021-12-11 – 2021-12-14 (×4): 50 ug via ORAL
  Filled 2021-12-10 (×4): qty 1

## 2021-12-10 MED ORDER — VENLAFAXINE HCL ER 37.5 MG PO CP24
37.5000 mg | ORAL_CAPSULE | Freq: Every day | ORAL | Status: DC
  Administered 2021-12-10 – 2021-12-14 (×5): 37.5 mg via ORAL
  Filled 2021-12-10 (×5): qty 1

## 2021-12-10 MED ORDER — SODIUM CHLORIDE 0.9 % IV SOLN: INTRAVENOUS | Status: AC

## 2021-12-10 MED ORDER — ALBUTEROL SULFATE (2.5 MG/3ML) 0.083% IN NEBU: 3.0000 mL | INHALATION_SOLUTION | RESPIRATORY_TRACT | Status: DC | PRN

## 2021-12-10 NOTE — Progress Notes (Signed)
Report given to night shift RN. RN was informed that the patient transferred to the unit about 1845 and a full head to toe assessment, including skin assessment by two RN's, and admission questionnaires had yet to be completed. Patient's Mom and Dad are both currently at the bedside and can answer any questions needed as they are guardian. Patient is resting comfortably and visitor's were shown how to use the call light if they were to require any assistance. ?

## 2021-12-10 NOTE — Progress Notes (Signed)
?PROGRESS NOTE ? ? ? ?Elijah Stanley  [id number] DOB: 06/12/1996 DOA: 12/09/2021 ?PCP: Rudene Anda, MD ? ? ?Brief Narrative:  ?HPI: ?Elijah Stanley is a 26 y.o. male with medical history significant for agenesis of corpus callosum, chromosome 1 deletion, developmental delay, left-sided hemiplegic cerebral palsy, central hypothyroidism, oropharyngeal dysphagia s/p G-tube, HTN, GAD, hearing loss and nonverbal status who presented to the ED for evaluation of vomiting and poor oral intake.  Patient is unable to provide history and is otherwise obtained by chart review and patient's mother at bedside. ? ?Patient has chronic oropharyngeal dysphagia with NG tube in place.  His mother states that he typically eats a soft diet by mouth and is given thin liquids through his G-tube.  He has required chronic use of MiraLAX to maintain regular bowel movements. ? ?2 days ago family noted that he appeared to be in apparent pain but was unable to indicate where his pain is located.  He then developed diarrhea followed by emesis.  They tried to keep up with his hydration by delivering fluids through his G-tube however again would develop diarrhea soon after. ? 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 ?  ?[facility] ED Course  Labs/Imaging on admission: I have personally reviewed following labs and imaging studies. ?  ?Initial vitals showed BP 130/90, pulse 122, RR 21, temp 98.0 ?F, SPO2 97% on room air. ?  ?Labs show sodium 128, potassium 3.8, chloride 86, bicarb 27, BUN 52, creatinine 1.24, serum glucose 95, LFTs within normal limits, WBC 10.3, hemoglobin 14.4, platelets 217,000, lipase 15. ?  ?Left femur x-ray negative for acute findings.  Atrophic thigh musculature noted.  Left tibia/fibula x-ray negative for fracture or bony lesions.  Osteopenia noted.  Left foot x-ray showed substantial deformity of the foot including severe pes cavus and  talonavicular varus without fracture. ?  ?Scrotal ultrasound showed right orchiectomy without abnormality of the left testicle. ?  ?CT abdomen/pelvis with contrast showed wall thickening and mucosal hyperenhancement diffusely throughout small bowel and within the rectum.  Findings favored to represent enterocolitis.  Low-grade partial SBO versus adynamic ileus also noted.  Chronic dilated distal esophagus with fluid within suggestive of dysmotility and/or Cresto esophageal reflux reported. ?  ?Patient was given 1 L normal saline, IV Toradol, IV Zofran.  EDP initially discussed with on-call general surgery who recommended GI consult.  [facility], Dr. Alessandra Bevels, was consulted and recommended medical admission and they will see in the morning.  The hospitalist service was consulted to admit for further evaluation and management. ? ?Assessment & Plan: ?  ?Principal Problem: ?  Enterocolitis ?Active Problems: ?  Ileus ([place]) ?  Hyponatremia ?  Central hypothyroidism ?  Generalized anxiety disorder ?  Hypertension ?  Left-sided hemiplegic cerebral palsy ([place]) ? ?Enterocolitis/Adynamic ileus versus partial SBO ?Findings of enterocolitis and adynamic ileus versus low-grade partial SBO seen on CT imaging.  Has chronic oropharyngeal dysphagia with G-tube in place to use as needed.  ED physician discussed his situation with general surgery on-call who did not think that patient has small bowel obstruction and defer admission to hospitalist service.  GI is aware of the patient and will see today.  Unsure whether antibiotics are indicated in his case due to enterocolitis, will defer to GI but in the meantime we will get procalcitonin. ?He is afebrile with no leukocytosis.  Continue IV hydration.  Per mother, he has not had any  further nausea vomiting but he continues to have diarrhea.  We will try clear liquid diet. ? ?Hyponatremia: Resolved. ?  ?Left-sided hemiplegic cerebral palsy (HCC) ?Chronic issue, stable.  No evidence of  acute fracture/abnormality on left lower extremity x-rays. ?  ?Hypertension: Blood pressure fairly stable. ? ?Sinus tachycardia: Metoprolol was held due to n.p.o. status.  Now that I am starting him on clear liquid diet, I will resume his metoprolol. ?  ?Generalized anxiety disorder: Resume home medications. ?  ?Central hypothyroidism: Resume home medication. ? ?DVT prophylaxis:  ?  Code Status: Full Code  ?Family Communication: Mother present at bedside.  Plan of care discussed with her in length. ? ?Status is: Observation ?The patient will require care spanning > 2 midnights and should be moved to inpatient because: Still symptomatic with enterocolitis. ? ? ?Estimated body mass index is 23.8 kg/m? as calculated from the following: ?  Height as of this encounter: 4' (1.219 m). ?  Weight as of this encounter: 35.4 kg. ? ?  ?Nutritional Assessment: ?Body mass index is 23.8 kg/m?Marland KitchenMarland Kitchen ?Seen by dietician.  I agree with the assessment and plan as outlined below: ?Nutrition Status: ?  ?  ?  ? ?. ?Skin Assessment: ?I have examined the patient's skin and I agree with the wound assessment as performed by the wound care RN as outlined below: ?  ? ?Consultants:  ?GI ? ?Procedures:  ?None ? ?Antimicrobials:  ?Anti-infectives (From admission, onward)  ? ? None  ? ?  ?  ? ? ?Subjective: ?Patient seen and examined.  Patient nonverbal and cannot cannot hear.  Mother at the bedside.  According to mother, she has noticed some diarrhea but she has not noted any further nausea vomiting. ? ?Objective: ?Vitals:  ? 12/09/21 1730 12/09/21 1750 12/09/21 2006 12/10/21 0030  ?BP: 120/66 (!) 114/57 131/74 (!) 123/58  ?Pulse:  (!) 120 (!) 121 (!) 114  ?Resp:  18 18 16   ?Temp:   97.8 ?F (36.6 ?C) 97.6 ?F (36.4 ?C)  ?TempSrc:   Oral Oral  ?SpO2:  98% 100% 100%  ?Weight:      ?Height:      ? ? ?Intake/Output Summary (Last 24 hours) at 12/10/2021 0937 ?Last data filed at 12/10/2021 1610 ?Gross per 24 hour  ?Intake 1829.04 ml  ?Output --  ?Net 1829.04  ml  ? ?Filed Weights  ? 12/09/21 0845  ?Weight: 35.4 kg  ? ? ?Examination: ? ?General exam: Appears calm and comfortable  ?Respiratory system: Clear to auscultation. Respiratory effort normal. ?Cardiovascular system: S1 & S2 heard, RRR. No JVD, murmurs, rubs, gallops or clicks. No pedal edema. ?Gastrointestinal system: Abdomen is nondistended, soft but with moderate generalized tenderness. No organomegaly or masses felt. Normal bowel sounds heard. ?Central nervous system: Alert but unable to assess orientation due to patient being nonverbal. ?Extremities: Contractured extremities. ? ?Data Reviewed: I have personally reviewed following labs and imaging studies ? ?CBC: ?Recent Labs  ?Lab 12/09/21 ?1010 12/10/21 ?0647  ?WBC 10.3 10.5  ?NEUTROABS 7.7  --   ?HGB 14.4 13.1  ?HCT 40.3 39.5  ?MCV 85.6 94.5  ?PLT 217 208  ? ?Basic Metabolic Panel: ?Recent Labs  ?Lab 12/09/21 ?1010 12/10/21 ?0647  ?NA 128* 136  ?K 3.8 3.7  ?CL 86* 105  ?CO2 27 18*  ?GLUCOSE 95 74  ?BUN 52* 36*  ?CREATININE 1.24 0.91  ?CALCIUM 9.7 8.8*  ? ?GFR: ?Estimated Creatinine Clearance: 48 mL/min (by C-G formula based on SCr of 0.91 mg/dL). ?Liver Function Tests: ?  Recent Labs  ?Lab 12/09/21 ?1010 12/10/21 ?0647  ?AST 20 19  ?ALT 18 18  ?ALKPHOS 93 82  ?BILITOT 1.0 1.5*  ?PROT 6.7 5.7*  ?ALBUMIN 3.9 3.1*  ? ?Recent Labs  ?Lab 12/09/21 ?1010  ?LIPASE 15  ? ?No results for input(s): AMMONIA in the last 168 hours. ?Coagulation Profile: ?No results for input(s): INR, PROTIME in the last 168 hours. ?Cardiac Enzymes: ?No results for input(s): CKTOTAL, CKMB, CKMBINDEX, TROPONINI in the last 168 hours. ?BNP (last 3 results) ?No results for input(s): PROBNP in the last 8760 hours. ?HbA1C: ?No results for input(s): HGBA1C in the last 72 hours. ?CBG: ?No results for input(s): GLUCAP in the last 168 hours. ?Lipid Profile: ?No results for input(s): CHOL, HDL, LDLCALC, TRIG, CHOLHDL, LDLDIRECT in the last 72 hours. ?Thyroid Function Tests: ?No results for input(s):  TSH, T4TOTAL, FREET4, T3FREE, THYROIDAB in the last 72 hours. ?Anemia Panel: ?No results for input(s): VITAMINB12, FOLATE, FERRITIN, TIBC, IRON, RETICCTPCT in the last 72 hours. ?Sepsis Labs: ?No results for

## 2021-12-10 NOTE — Progress Notes (Signed)
?   12/10/21 0953  ?Provider Notification  ?Provider Name/Title Dr. Doristine Bosworth  ?Date Provider Notified 12/10/21  ?Time Provider Notified [phone]  ?Method of Notification Page  ?Notification Reason Other (Comment) ?(diet ordered thin liquids, patient take pudding thick at home, ok to change? [place] for patient to take pills with apple sauce? restart IVF or add water flushes per peg tube?)  ?Provider response See new orders ?(ok to change to pudding thick, patient may also take pills with apple sauce. Restart IVF at previously ordered rate.)  ?Date of Provider Response 12/10/21  ?Time of Provider Response 1000  ? ? ?

## 2021-12-10 NOTE — TOC Initial Note (Signed)
Transition of Care (TOC) - Initial/Assessment Note  ? ? ?Patient Details  ?Name: Elijah Stanley ?MRN: [id number] ?Date of Birth: [birth year] ? ?Transition of Care (TOC) CM/SW Contact:    ?Davey Limas, Marjie Skiff, RN ?Phone Number: ?12/10/2021, 1:41 PM ? ?Clinical Narrative:                 ?Pt from home with parents. He is totally dependent for care as he has developmental delay and cerebral palsy. TOC will follow along and assist with dc as needed. ? ?Expected Discharge Plan: Home/Self Care ?Barriers to Discharge: Continued Medical Work up ? ?  ? ?Expected Discharge Plan and Services ?Expected Discharge Plan: Home/Self Care ?  ?Discharge Planning Services: CM Consult ?  ?Living arrangements for the past 2 months: Single Family Home ?                ?  ? Prior Living Arrangements/Services ?Living arrangements for the past 2 months: [place] ?Lives with:: Parents ?Patient language and need for interpreter reviewed:: Yes ?       ?Need for Family Participation in Patient Care: Yes (Comment) ?Care giver support system in place?: Yes (comment) ?  ?Criminal Activity/Legal Involvement Pertinent to Current Situation/Hospitalization: No - Comment as needed ? ?  ?Alcohol / Substance Use: Not Applicable ?Psych Involvement: No (comment) ? ?Admission diagnosis:  Enterocolitis [K52.9] ?Ileus ([place]) [K56.7] ?Patient Active Problem List  ? Diagnosis Date Noted  ? Ileus ([place]) 12/09/2021  ? Hyponatremia 12/09/2021  ? Hypertension 12/15/2020  ? Enterocolitis 09/04/2020  ? Generalized anxiety disorder 03/23/2017  ? Central hypothyroidism 10/06/2011  ? Left-sided hemiplegic cerebral palsy ([place]) 10/06/2011  ? ?PCP:  Rudene Anda, MD ?Pharmacy:   ?[id number] [address], [address] AT [facility] [address] ?[place] ?[place] [address] ?Phone: [phone] Fax: [phone] ? ? ? ? ?Social Determinants of Health (SDOH) Interventions ?  ? ?Readmission Risk Interventions ?   ? View : No data to  display.  ?  ?  ?  ? ? ? ?

## 2021-12-10 NOTE — Progress Notes (Signed)
?   12/10/21 1028  ?Assess: MEWS Score  ?Temp 97.7 ?F (36.5 ?C)  ?BP (!) 104/92  ?Pulse Rate (!) 124  ?Resp 16  ?SpO2 100 %  ?O2 Device Room Air  ?Assess: MEWS Score  ?MEWS Temp 0  ?MEWS Systolic 0  ?MEWS Pulse 2  ?MEWS RR 0  ?MEWS LOC 0  ?MEWS Score 2  ?MEWS Score Color Yellow  ?Assess: if the MEWS score is Yellow or Red  ?Were vital signs taken at a resting state? Yes  ?Focused Assessment No change from prior assessment  ?Does the patient meet 2 or more of the SIRS criteria? No  ?MEWS guidelines implemented *See Row Information* No, previously yellow, continue vital signs every 4 hours  ?Treat  ?MEWS Interventions Administered scheduled meds/treatments  ?Escalate  ?MEWS: Escalate Yellow: discuss with charge nurse/RN and consider discussing with provider and RRT  ?Notify: Charge Nurse/RN  ?Name of Charge Nurse/RN Notified Hilton Cork, RN  ?Date Charge Nurse/RN Notified 12/10/21  ?Time Charge Nurse/RN Notified 1028  ?Assess: SIRS CRITERIA  ?SIRS Temperature  0  ?SIRS Pulse 1  ?SIRS Respirations  0  ?SIRS WBC 0  ?SIRS Score Sum  1  ? ?Patient was previously Yellow MEWS. Will continue to take vitals Q4 hours. Scheduled metoprolol given, will reassess vitals to see if heart rate improves after medication. ?

## 2021-12-10 NOTE — Consult Note (Signed)
Referring Provider: TRH ?Primary Care Physician:  Truett Perna, MD ?Primary Gastroenterologist:   ? ?Reason for Consultation:  Diarrhea, vomiting ? ?HPI: Elijah Stanley is a 26 y.o. male with medical history significant for agenesis of corpus callosum, chromosome 1 deletion, developmental delay, left-sided hemiplegic cerebral palsy, central hypothyroidism, oropharyngeal dysphagia s/p G-tube, HTN, GAD, hearing loss and nonverbal status who presented to the ED for evaluation of vomiting and poor oral intake. ? ?Patient is accompanied by his mother.  His mother notes that on Tuesday he began to have vomiting and diarrhea.  Since Tuesday he has had 4-5 episodes of vomiting, no blood in emesis.  He was having frequent diarrhea but unable to determine now often.  Notes 2 episodes of watery diarrhea in the last 24 hours.  He has also had associated abdominal pain that began around Sunday.  Notes the stomach pain has improved.  Mother also notes the patient was hungry this morning. ? ?He normally eats solids through oral intake and receives fluids through his G-tube.  Since Monday he has refused oral intake.  He notes anytime she tries to give fluids through the G-tube he will either have vomiting or diarrhea. ? ?She mentioned the patient does go to a day program upon inquiry to the program it seems there may be illness going around at the day program. No sick contacts at home. ? ?He has previously had 1 episode of colitis similar to this in January 2022.  Was treated with antibiotics and improved.   ? ?Reports family history of Crohn's disease in paternal aunt.  Family history of celiac disease in maternal aunt. Distant family history of colon cancer. ? ?Denies melena, hematochezia, constipation, hematemesis. ?Denies blood thinner use.  Denies previous colonoscopy or EGD. ? ?Past Medical History:  ?Diagnosis Date  ? Cerebral palsy (HCC)   ? Chromosome 1 deletion syndrome   ? Hearing loss   ? bilateral  ? [place] lymphedema    ? Thyroid disease   ? hypothyroidism  ? Unilateral weakness   ? Left  ? ? ?Past Surgical History:  ?Procedure Laterality Date  ? COARCTATION OF AORTA REPAIR    ? GASTROSTOMY TUBE PLACEMENT    ? HERNIA REPAIR    ? ? ?Prior to Admission medications   ?Medication Sig Start Date End Date Taking? Authorizing Provider  ?albuterol (PROVENTIL) (2.5 MG/3ML) 0.083% nebulizer solution Inhale 3 mLs into the lungs as needed for wheezing or shortness of breath. 09/28/20  Yes [provider]  ?carbamide peroxide (DEBROX) 6.5 % OTIC solution Place 5 drops into both ears as needed. For ear wax 04/09/21  Yes [provider]  ?Desvenlafaxine Succinate ER 25 MG TB24 Take 1 tablet by mouth daily. 08/25/20  Yes [provider]  ?levothyroxine (SYNTHROID, LEVOTHROID) 50 MCG tablet Take 50 mcg by mouth daily before breakfast. 25 mcg on Saturdays and Sundays   Yes [provider]  ?metoprolol tartrate (LOPRESSOR) 25 MG tablet Take 12.5 mg by mouth 2 (two) times daily. 09/13/21  Yes [provider]  ?multivitamin (VIT W/EXTRA C) CHEW chewable tablet Chew 1 tablet by mouth daily.   Yes [provider]  ?nystatin (MYCOSTATIN/NYSTOP) powder Apply a small amount topically to affected area 2-3x daily as needed for G-Tube site irritation 09/10/21  Yes [provider]  ?OXYGEN Concentrator to keep O2 sats > 92% as needed   Yes [provider]  ?polyethylene glycol (MIRALAX / GLYCOLAX) packet Take 17 g by mouth daily.  Yes [provider]  ?triamcinolone ointment (KENALOG) 0.1 % Apply a small amount to affected area 2-3 times daily as needed around G-tube. 09/10/21  Yes [provider]  ? ? ?Scheduled Meds: ? enoxaparin (LOVENOX) injection  20 mg Subcutaneous Q24H  ? ?Continuous Infusions: ? sodium chloride 75 mL/hr at 12/09/21 2009  ? ?PRN Meds:.acetaminophen **OR** acetaminophen, ketorolac, ondansetron **OR** ondansetron (ZOFRAN) IV ? ?Allergies as of 12/09/2021  - Review Complete 12/09/2021  ?Allergen Reaction Noted  ? Rocephin [ceftriaxone sodium in dextrose] Rash 10/07/2014  ? ? ?History reviewed. No pertinent family history. ? ?Social History  ? ?Socioeconomic History  ? Marital status: Single  ?  Spouse name: Not on file  ? Number of children: Not on file  ? Years of education: Not on file  ? Highest education level: Not on file  ?Occupational History  ? Not on file  ?Tobacco Use  ? Smoking status: Never  ? Smokeless tobacco: Never  ?Substance and Sexual Activity  ? Alcohol use: No  ? Drug use: No  ? Sexual activity: Not on file  ?Other Topics Concern  ? Not on file  ?Social History Narrative  ? Not on file  ? ?Social Determinants of Health  ? ?Financial Resource Strain: Not on file  ?Food Insecurity: Not on file  ?Transportation Needs: Not on file  ?Physical Activity: Not on file  ?Stress: Not on file  ?Social Connections: Not on file  ?Intimate Partner Violence: Not on file  ? ? ?Review of Systems: Review of Systems  ?Constitutional:  Negative for chills and fever.  ?HENT:  Negative for hearing loss and tinnitus.   ?Eyes:  Negative for blurred vision and double vision.  ?Respiratory:  Negative for cough and sputum production.   ?Cardiovascular:  Negative for chest pain and palpitations.  ?Gastrointestinal:  Positive for abdominal pain, diarrhea and vomiting. Negative for blood in stool, constipation, heartburn, melena and nausea.  ?Genitourinary:  Negative for dysuria, frequency and urgency.  ?Musculoskeletal:  Negative for myalgias and neck pain.  ?Skin:  Negative for itching and rash.  ?Neurological:  Negative for dizziness and headaches.  ?Endo/Heme/Allergies:  Negative for environmental allergies. Does not bruise/bleed easily.   ? ?Physical Exam:Physical Exam ?Constitutional:   ?   General: He is not in acute distress. ?   Appearance: He is normal weight. He is not ill-appearing.  ?HENT:  ?   Head: Atraumatic.  ?   Right Ear: External ear normal.  ?   Left Ear:  External ear normal.  ?   Nose: Nose normal.  ?   Mouth/Throat:  ?   Mouth: Mucous membranes are moist.  ?Eyes:  ?   Conjunctiva/sclera: Conjunctivae normal.  ?   Pupils: Pupils are equal, round, and reactive to light.  ?Cardiovascular:  ?   Rate and Rhythm: Normal rate and regular rhythm.  ?   Pulses: Normal pulses.  ?   Heart sounds: Normal heart sounds.  ?Pulmonary:  ?   Effort: Pulmonary effort is normal.  ?   Breath sounds: Normal breath sounds.  ?Abdominal:  ?   General: Abdomen is flat. Bowel sounds are normal. There is no distension.  ?   Palpations: Abdomen is soft. There is no mass.  ?   Tenderness: There is no abdominal tenderness. There is no guarding or rebound.  ?   Hernia: No hernia is present.  ?Musculoskeletal:     ?   General: No swelling. Normal range of motion.  ?  Cervical back: Normal range of motion.  ?Skin: ?   General: Skin is warm and dry.  ?   Coloration: Skin is not pale.  ?Neurological:  ?   General: No focal deficit present.  ?   Mental Status: He is alert. Mental status is at baseline.  ?Psychiatric:     ?   Mood and Affect: Mood normal.     ?   Behavior: Behavior normal.  ?  ?Vital signs: ?Vitals:  ? 12/09/21 2006 12/10/21 0030  ?BP: 131/74 (!) 123/58  ?Pulse: (!) 121 (!) 114  ?Resp: 18 16  ?Temp: 97.8 ?F (36.6 ?C) 97.6 ?F (36.4 ?C)  ?SpO2: 100% 100%  ? ?Last BM Date : 12/09/21 ? ? ? ?GI:  ?Lab Results: ?Recent Labs  ?  12/09/21 ?1010 12/10/21 ?0647  ?WBC 10.3 10.5  ?HGB 14.4 13.1  ?HCT 40.3 39.5  ?PLT 217 208  ? ?BMET ?Recent Labs  ?  12/09/21 ?1010  ?NA 128*  ?K 3.8  ?CL 86*  ?CO2 27  ?GLUCOSE 95  ?BUN 52*  ?CREATININE 1.24  ?CALCIUM 9.7  ? ?LFT ?Recent Labs  ?  12/09/21 ?1010  ?PROT 6.7  ?ALBUMIN 3.9  ?AST 20  ?ALT 18  ?ALKPHOS 93  ?BILITOT 1.0  ? ?PT/INR ?No results for input(s): LABPROT, INR in the last 72 hours. ? ? ?Studies/Results: ?DG Tibia/Fibula Left ? ?Result Date: 12/09/2021 ?CLINICAL DATA:  Patient with cerebral palsy, nonverbal with pain at the left testicle. His left  lower extremity stays in fixed flexed position with inability to fully extend EXAM: LEFT TIBIA AND FIBULA - 2 VIEW COMPARISON:  No previous studies available for comparison FINDINGS: There is no evidence

## 2021-12-11 DIAGNOSIS — K529 Noninfective gastroenteritis and colitis, unspecified: Secondary | ICD-10-CM | POA: Diagnosis not present

## 2021-12-11 LAB — BASIC METABOLIC PANEL
Anion gap: 9 (ref 5–15)
BUN: 20 mg/dL (ref 6–20)
CO2: 20 mmol/L — ABNORMAL LOW (ref 22–32)
Calcium: 8.9 mg/dL (ref 8.9–10.3)
Chloride: 111 mmol/L (ref 98–111)
Creatinine, Ser: 0.67 mg/dL (ref 0.61–1.24)
GFR, Estimated: >60 mL/min (ref >60)
Glucose, Bld: 81 mg/dL (ref 70–99)
Potassium: 3.3 mmol/L — ABNORMAL LOW (ref 3.5–5.1)
Sodium: 140 mmol/L (ref 135–145)

## 2021-12-11 LAB — CBC WITH DIFFERENTIAL/PLATELET
Abs Immature Granulocytes: 0.41 10*3/uL — ABNORMAL HIGH (ref 0.00–0.07)
Basophils Absolute: 0 10*3/uL (ref 0.0–0.1)
Basophils Relative: 1 %
Eosinophils Absolute: 0.1 10*3/uL (ref 0.0–0.5)
Eosinophils Relative: 1 %
HCT: 35.2 % — ABNORMAL LOW (ref 39.0–52.0)
Hemoglobin: 11.5 g/dL — ABNORMAL LOW (ref 13.0–17.0)
Immature Granulocytes: 5 %
Lymphocytes Relative: 17 %
Lymphs Abs: 1.4 10*3/uL (ref 0.7–4.0)
MCH: 30.4 pg (ref 26.0–34.0)
MCHC: 32.7 g/dL (ref 30.0–36.0)
MCV: 93.1 fL (ref 80.0–100.0)
Monocytes Absolute: 1.8 10*3/uL — ABNORMAL HIGH (ref 0.1–1.0)
Monocytes Relative: 23 %
Neutro Abs: 4.4 10*3/uL (ref 1.7–7.7)
Neutrophils Relative %: 53 %
Platelets: 193 10*3/uL (ref 150–400)
RBC: 3.78 MIL/uL — ABNORMAL LOW (ref 4.22–5.81)
RDW: 14.3 % (ref 11.5–15.5)
WBC: 8.2 10*3/uL (ref 4.0–10.5)
nRBC: 0 % (ref 0.0–0.2)

## 2021-12-11 LAB — C DIFFICILE QUICK SCREEN W PCR REFLEX
C Diff antigen: NEGATIVE
C Diff interpretation: NOT DETECTED
C Diff toxin: NEGATIVE

## 2021-12-11 MED ORDER — POTASSIUM CHLORIDE CRYS ER 20 MEQ PO TBCR
40.0000 meq | EXTENDED_RELEASE_TABLET | Freq: Once | ORAL | Status: AC
  Administered 2021-12-11: 40 meq via ORAL
  Filled 2021-12-11: qty 2

## 2021-12-11 MED ORDER — LIP MEDEX EX OINT
TOPICAL_OINTMENT | CUTANEOUS | Status: DC | PRN
  Filled 2021-12-11: qty 7

## 2021-12-11 NOTE — Progress Notes (Signed)
?PROGRESS NOTE ? ? ? ?Elijah Stanley  ZOX:[id number] DOB: 1995-10-15 DOA: 12/09/2021 ?PCP: Truett Perna, MD ? ? ?Brief Narrative:  ?HPI: ?Elijah Stanley is a 26 y.o. male with medical history significant for agenesis of corpus callosum, chromosome 1 deletion, developmental delay, left-sided hemiplegic cerebral palsy, central hypothyroidism, oropharyngeal dysphagia s/p G-tube, HTN, GAD, hearing loss and nonverbal status who presented to the ED for evaluation of vomiting and poor oral intake.  Patient is unable to provide history and is otherwise obtained by chart review and patient's mother at bedside. ? ?Patient has chronic oropharyngeal dysphagia with NG tube in place.  His mother states that he typically eats a soft diet by mouth and is given thin liquids through his G-tube.  He has required chronic use of MiraLAX to maintain regular bowel movements. ? ?2 days ago family noted that he appeared to be in apparent pain but was unable to indicate where his pain is located.  He then developed diarrhea followed by emesis.  They tried to keep up with his hydration by delivering fluids through his G-tube however again would develop diarrhea soon after. ? 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 ?  ?[facility] ED Course  Labs/Imaging on admission: I have personally reviewed following labs and imaging studies. ?  ?Initial vitals showed BP 130/90, pulse 122, RR 21, temp 98.0 ?F, SPO2 97% on room air. ?  ?Labs show sodium 128, potassium 3.8, chloride 86, bicarb 27, BUN 52, creatinine 1.24, serum glucose 95, LFTs within normal limits, WBC 10.3, hemoglobin 14.4, platelets 217,000, lipase 15. ?  ?Left femur x-ray negative for acute findings.  Atrophic thigh musculature noted.  Left tibia/fibula x-ray negative for fracture or bony lesions.  Osteopenia noted.  Left foot x-ray showed substantial deformity of the foot including severe pes cavus and  talonavicular varus without fracture. ?  ?Scrotal ultrasound showed right orchiectomy without abnormality of the left testicle. ?  ?CT abdomen/pelvis with contrast showed wall thickening and mucosal hyperenhancement diffusely throughout small bowel and within the rectum.  Findings favored to represent enterocolitis.  Low-grade partial SBO versus adynamic ileus also noted.  Chronic dilated distal esophagus with fluid within suggestive of dysmotility and/or Cresto esophageal reflux reported. ?  ?Patient was given 1 L normal saline, IV Toradol, IV Zofran.  EDP initially discussed with on-call general surgery who recommended GI consult.  [facility], Dr. Levora Angel, was consulted and recommended medical admission and they will see in the morning.  The hospitalist service was consulted to admit for further evaluation and management. ? ?Assessment & Plan: ?  ?Principal Problem: ?  Enterocolitis ?Active Problems: ?  Ileus (HCC) ?  Hyponatremia ?  Central hypothyroidism ?  Generalized anxiety disorder ?  Hypertension ?  Left-sided hemiplegic cerebral palsy (HCC) ? ?Enterocolitis/Adynamic ileus versus partial SBO ?Findings of enterocolitis and adynamic ileus versus low-grade partial SBO seen on CT imaging.  Has chronic oropharyngeal dysphagia with G-tube in place to use as needed.  ED physician discussed his situation with general surgery on-call who did not think that patient has small bowel obstruction and defer admission to hospitalist service.  GI is on board.  Per father was at the bedside, patient has had 2 bowel movements overnight.  No signs of nausea from father.  Patient does not take anything liquid orally and he has been on clear liquid diet which is basically equal and n.p.o. for him.  At this  point in time and I will advance him to soft diet so he can have something orally so we can see if he has any symptoms/nausea.  Father agrees with this plan and appreciates this. ? ?Hyponatremia: Resolved. ? ?Hypokalemia: We  will replace. ?  ?Left-sided hemiplegic cerebral palsy (HCC) ?Chronic issue, stable.  No evidence of acute fracture/abnormality on left lower extremity x-rays. ?  ?Hypertension: Blood pressure fairly stable. ? ?Sinus tachycardia: Resolved.  Continue metoprolol. ?  ?Generalized anxiety disorder: Continue home medications. ?  ?Central hypothyroidism: Continue home medication. ? ?DVT prophylaxis:  ?  Code Status: Full Code  ?Family Communication: Father present at bedside.  Plan of care discussed with her in length. ?Status is: Inpatient ?Remains inpatient appropriate because: Advancing diet slowly. ? ? ? ? ?Estimated body mass index is 23.8 kg/m? as calculated from the following: ?  Height as of this encounter: 4' (1.219 m). ?  Weight as of this encounter: 35.4 kg. ? ?  ?Nutritional Assessment: ?Body mass index is 23.8 kg/m?Marland Kitchen.Marland Kitchen. ?Seen by dietician.  I agree with the assessment and plan as outlined below: ?Nutrition Status: ?  ?  ?  ? ?. ?Skin Assessment: ?I have examined the patient's skin and I agree with the wound assessment as performed by the wound care RN as outlined below: ?  ? ?Consultants:  ?GI ? ?Procedures:  ?None ? ?Antimicrobials:  ?Anti-infectives (From admission, onward)  ? ? None  ? ?  ?  ? ? ?Subjective: ? ?Seen and examined.  Patient nonverbal.  Appears comfortable.  Father at the bedside. ? ?Objective: ?Vitals:  ? 12/10/21 1611 12/10/21 2229 12/11/21 [id number] 12/11/21 0835  ?BP: 136/79 133/70 128/70 138/74  ?Pulse: 94 94 90 76  ?Resp: 16 18 18    ?Temp:  98 ?F (36.7 ?C) (!) 97.5 ?F (36.4 ?C)   ?TempSrc:  Oral Oral   ?SpO2: 96% 97% 99% 99%  ?Weight:      ?Height:      ? ? ?Intake/Output Summary (Last 24 hours) at 12/11/2021 1313 ?Last data filed at 12/11/2021 0500 ?Gross per 24 hour  ?Intake 1336.6 ml  ?Output --  ?Net 1336.6 ml  ? ? ?Filed Weights  ? 12/09/21 0845  ?Weight: 35.4 kg  ? ? ?Examination: ? ?General exam: Appears calm and comfortable  ?Respiratory system: Clear to auscultation. Respiratory effort  normal. ?Cardiovascular system: S1 & S2 heard, RRR. No JVD, murmurs, rubs, gallops or clicks. No pedal edema. ?Gastrointestinal system: Abdomen is nondistended, soft and nontender. No organomegaly or masses felt. Normal bowel sounds heard. ?Central nervous system: Alert but unable to assess orientation due to nonverbal status. ? ?Data Reviewed: I have personally reviewed following labs and imaging studies ? ?CBC: ?Recent Labs  ?Lab 12/09/21 ?1010 12/10/21 ?0647 12/11/21 ?[id number]  ?WBC 10.3 10.5 8.2  ?NEUTROABS 7.7  --  4.4  ?HGB 14.4 13.1 11.5*  ?HCT 40.3 39.5 35.2*  ?MCV 85.6 94.5 93.1  ?PLT 217 208 193  ? ? ?Basic Metabolic Panel: ?Recent Labs  ?Lab 12/09/21 ?1010 12/10/21 ?0647 12/11/21 ?[id number]  ?NA 128* 136 140  ?K 3.8 3.7 3.3*  ?CL 86* 105 111  ?CO2 27 18* 20*  ?GLUCOSE 95 74 81  ?BUN 52* 36* 20  ?CREATININE 1.24 0.91 0.67  ?CALCIUM 9.7 8.8* 8.9  ? ? ?GFR: ?Estimated Creatinine Clearance: 54.6 mL/min (by C-G formula based on SCr of 0.67 mg/dL). ?Liver Function Tests: ?Recent Labs  ?Lab 12/09/21 ?1010 12/10/21 ?0647  ?AST 20 19  ?ALT 18 18  ?  ALKPHOS 93 82  ?BILITOT 1.0 1.5*  ?PROT 6.7 5.7*  ?ALBUMIN 3.9 3.1*  ? ? ?Recent Labs  ?Lab 12/09/21 ?1010  ?LIPASE 15  ? ? ?No results for input(s): AMMONIA in the last 168 hours. ?Coagulation Profile: ?No results for input(s): INR, PROTIME in the last 168 hours. ?Cardiac Enzymes: ?No results for input(s): CKTOTAL, CKMB, CKMBINDEX, TROPONINI in the last 168 hours. ?BNP (last 3 results) ?No results for input(s): PROBNP in the last 8760 hours. ?HbA1C: ?No results for input(s): HGBA1C in the last 72 hours. ?CBG: ?No results for input(s): GLUCAP in the last 168 hours. ?Lipid Profile: ?No results for input(s): CHOL, HDL, LDLCALC, TRIG, CHOLHDL, LDLDIRECT in the last 72 hours. ?Thyroid Function Tests: ?No results for input(s): TSH, T4TOTAL, FREET4, T3FREE, THYROIDAB in the last 72 hours. ?Anemia Panel: ?No results for input(s): VITAMINB12, FOLATE, FERRITIN, TIBC, IRON, RETICCTPCT in  the last 72 hours. ?Sepsis Labs: ?Recent Labs  ?Lab 12/10/21 ?0647  ?PROCALCITON 2.42  ? ? ?No results found for this or any previous visit (from the past 240 hour(s)).  ? ?Radiology Studies: ?CT ABDOMEN

## 2021-12-11 NOTE — Progress Notes (Signed)
[facility] Progress Note ? ?TAHJEE MONDAY 26 y.o. September 01, 1995 ? ? ?Subjective: ?Two loose stools overnight. No fluid in G-tube drainage to gravity. No vomiting overnight. Father at bedside and denies that he has complained of abdominal pain. Nurse in room. ? ?Objective: ?Vital signs: ?Vitals:  ? 12/11/21 0453 12/11/21 0835  ?BP: 128/70 138/74  ?Pulse: 90 76  ?Resp: 18   ?Temp: (!) 97.5 ?F (36.4 ?C)   ?SpO2: 99% 99%  ? ? ?Physical Exam: ?Gen: lethargic, thin, non-verbal, no acute distress  ?HEENT: anicteric sclera ?CV: RRR ?Chest: CTA B ?Abd: soft, nontender, nondistended, +BS, G-tube intact to gravity drainage ?Ext: no edema, contractures ? ?Lab Results: ?Recent Labs  ?  12/10/21 ?[id number] 12/11/21 ?[id number]  ?NA 136 140  ?K 3.7 3.3*  ?CL 105 111  ?CO2 18* 20*  ?GLUCOSE 74 81  ?BUN 36* 20  ?CREATININE 0.91 0.67  ?CALCIUM 8.8* 8.9  ? ?Recent Labs  ?  12/09/21 ?1010 12/10/21 ?0647  ?AST 20 19  ?ALT 18 18  ?ALKPHOS 93 82  ?BILITOT 1.0 1.5*  ?PROT 6.7 5.7*  ?ALBUMIN 3.9 3.1*  ? ?Recent Labs  ?  12/09/21 ?1010 12/10/21 ?0647 12/11/21 ?[id number]  ?WBC 10.3 10.5 8.2  ?NEUTROABS 7.7  --  4.4  ?HGB 14.4 13.1 11.5*  ?HCT 40.3 39.5 35.2*  ?MCV 85.6 94.5 93.1  ?PLT 217 208 193  ? ? ? ? ?Assessment/Plan: ?Suspect viral gastroenteritis - continue NPO and G-tube drainage today. If no vomiting then consider trickle G-tube feeds tomorrow. Stool sample not collected as of yet. Continue supportive care. Will f/u. ? ? ?Lear Ng ?12/11/2021, 9:55 AM ? ?Questions please call [phone] Patient ID: NIKIA FRANCES, male   DOB: [birth year], 26 y.o.   MRN: [id number] ? ?

## 2021-12-12 ENCOUNTER — Inpatient Hospital Stay (HOSPITAL_COMMUNITY): Payer: Medicaid Other

## 2021-12-12 DIAGNOSIS — K529 Noninfective gastroenteritis and colitis, unspecified: Secondary | ICD-10-CM | POA: Diagnosis not present

## 2021-12-12 LAB — BASIC METABOLIC PANEL
Anion gap: 7 (ref 5–15)
BUN: 9 mg/dL (ref 6–20)
CO2: 21 mmol/L — ABNORMAL LOW (ref 22–32)
Calcium: 8.3 mg/dL — ABNORMAL LOW (ref 8.9–10.3)
Chloride: 112 mmol/L — ABNORMAL HIGH (ref 98–111)
Creatinine, Ser: 0.56 mg/dL — ABNORMAL LOW (ref 0.61–1.24)
GFR, Estimated: >60 mL/min (ref >60)
Glucose, Bld: 72 mg/dL (ref 70–99)
Potassium: 3.6 mmol/L (ref 3.5–5.1)
Sodium: 140 mmol/L (ref 135–145)

## 2021-12-12 LAB — GASTROINTESTINAL PANEL BY PCR, STOOL (REPLACES STOOL CULTURE)

## 2021-12-12 NOTE — Progress Notes (Signed)
?PROGRESS NOTE ? ? ? ?[name]  [id number] DOB: 06-26-1996 DOA: 12/09/2021 ?PCP: Rudene Anda, MD ? ? ?Brief Narrative:  ?HPI: ?Elijah Stanley is a 26 y.o. male with medical history significant for agenesis of corpus callosum, chromosome 1 deletion, developmental delay, left-sided hemiplegic cerebral palsy, central hypothyroidism, oropharyngeal dysphagia s/p G-tube, HTN, GAD, hearing loss and nonverbal status who presented to the ED for evaluation of vomiting and poor oral intake.  Patient is unable to provide history and is otherwise obtained by chart review and patient's mother at bedside. ? ?Patient has chronic oropharyngeal dysphagia with NG tube in place.  His mother states that he typically eats a soft diet by mouth and is given thin liquids through his G-tube.  He has required chronic use of MiraLAX to maintain regular bowel movements. ? ?2 days ago family noted that he appeared to be in apparent pain but was unable to indicate where his pain is located.  He then developed diarrhea followed by emesis.  They tried to keep up with his hydration by delivering fluids through his G-tube however again would develop diarrhea soon after. ? 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 ?  ?[facility] ED Course  Labs/Imaging on admission: I have personally reviewed following labs and imaging studies. ?  ?Initial vitals showed BP 130/90, pulse 122, RR 21, temp 98.0 ?F, SPO2 97% on room air. ?  ?Labs show sodium 128, potassium 3.8, chloride 86, bicarb 27, BUN 52, creatinine 1.24, serum glucose 95, LFTs within normal limits, WBC 10.3, hemoglobin 14.4, platelets 217,000, lipase 15. ?  ?Left femur x-ray negative for acute findings.  Atrophic thigh musculature noted.  Left tibia/fibula x-ray negative for fracture or bony lesions.  Osteopenia noted.  Left foot x-ray showed substantial deformity of the foot including severe pes cavus and  talonavicular varus without fracture. ?  ?Scrotal ultrasound showed right orchiectomy without abnormality of the left testicle. ?  ?CT abdomen/pelvis with contrast showed wall thickening and mucosal hyperenhancement diffusely throughout small bowel and within the rectum.  Findings favored to represent enterocolitis.  Low-grade partial SBO versus adynamic ileus also noted.  Chronic dilated distal esophagus with fluid within suggestive of dysmotility and/or Cresto esophageal reflux reported. ?  ?Patient was given 1 L normal saline, IV Toradol, IV Zofran.  EDP initially discussed with on-call general surgery who recommended GI consult.  [facility], Dr. Alessandra Bevels, was consulted and recommended medical admission and they will see in the morning.  The hospitalist service was consulted to admit for further evaluation and management. ? ?Assessment & Plan: ?  ?Principal Problem: ?  Enterocolitis ?Active Problems: ?  Ileus ([place]) ?  Hyponatremia ?  Central hypothyroidism ?  Generalized anxiety disorder ?  Hypertension ?  Left-sided hemiplegic cerebral palsy ([place]) ? ?Enterocolitis/Adynamic ileus versus partial SBO ?Findings of enterocolitis and adynamic ileus versus low-grade partial SBO seen on CT imaging.  Has chronic oropharyngeal dysphagia with G-tube in place to use as needed.  ED physician discussed his situation with general surgery on-call who did not think that patient has small bowel obstruction and defer admission to hospitalist service.  GI is on board.  Per father was at the bedside, patient has been tolerating soft diet but has been having significant diarrhea since yesterday.  C. difficile is negative.  GI pathogen panel is pending.  If that would be negative, will start patient on Imodium.  We will keep in  the hospital and wait for GI to see him and clear for G-tube fluids.  Repeat abdominal x-ray does not show any bowel obstruction anymore. ? ?Hyponatremia: Resolved. ? ?Hypokalemia: Resolved. ?  ?Left-sided  hemiplegic cerebral palsy ([place]) ?Chronic issue, stable.  No evidence of acute fracture/abnormality on left lower extremity x-rays. ?  ?Hypertension: Blood pressure fairly stable. ? ?Sinus tachycardia: Resolved.  Continue metoprolol. ?  ?Generalized anxiety disorder: Continue home medications. ?  ?Central hypothyroidism: Continue home medication. ? ?DVT prophylaxis:  ?  Code Status: Full Code  ?Family Communication: Father present at bedside.  Plan of care discussed with her in length. ?Status is: Inpatient ?Remains inpatient appropriate because: Advancing diet slowly. ? ? ? ? ?Estimated body mass index is 26.37 kg/m? as calculated from the following: ?  Height as of this encounter: 4' (1.219 m). ?  Weight as of this encounter: 39.2 kg. ? ?  ?Nutritional Assessment: ?Body mass index is 26.37 kg/m?Marland KitchenMarland Kitchen ?Seen by dietician.  I agree with the assessment and plan as outlined below: ?Nutrition Status: ?  ?  ?  ? ?. ?Skin Assessment: ?I have examined the patient's skin and I agree with the wound assessment as performed by the wound care RN as outlined below: ?  ? ?Consultants:  ?GI ? ?Procedures:  ?None ? ?Antimicrobials:  ?Anti-infectives (From admission, onward)  ? ? None  ? ?  ?  ? ? ?Subjective: ? ?Seen and examined.  Patient nonverbal.  Sitting in the bed.  Looks comfortable. ? ?Objective: ?Vitals:  ? 12/11/21 1428 12/11/21 2033 12/12/21 0507 12/12/21 0515  ?BP: 127/75 126/71 (!) 128/93   ?Pulse: 86 81 81   ?Resp:  16 16   ?Temp: (!) 97.5 ?F (36.4 ?C) 98 ?F (36.7 ?C) (!) 97.4 ?F (36.3 ?C)   ?TempSrc: Oral Oral Oral   ?SpO2: 99% 100% 100%   ?Weight:    39.2 kg  ?Height:      ? ? ?Intake/Output Summary (Last 24 hours) at 12/12/2021 1331 ?Last data filed at 12/12/2021 1200 ?Gross per 24 hour  ?Intake 2377.1 ml  ?Output --  ?Net 2377.1 ml  ? ? ?Filed Weights  ? 12/09/21 0845 12/12/21 0515  ?Weight: 35.4 kg 39.2 kg  ? ? ?Examination: ? ?General exam: Appears calm and comfortable  ?Respiratory system: Clear to auscultation.  Respiratory effort normal. ?Cardiovascular system: S1 & S2 heard, RRR. No JVD, murmurs, rubs, gallops or clicks. No pedal edema. ?Gastrointestinal system: Abdomen is nondistended, soft and nontender. No organomegaly or masses felt. Normal bowel sounds heard. ?Central nervous system: Alert but not oriented. ? ?Data Reviewed: I have personally reviewed following labs and imaging studies ? ?CBC: ?Recent Labs  ?Lab 12/09/21 ?1010 12/10/21 ?0647 12/11/21 ?[id number]  ?WBC 10.3 10.5 8.2  ?NEUTROABS 7.7  --  4.4  ?HGB 14.4 13.1 11.5*  ?HCT 40.3 39.5 35.2*  ?MCV 85.6 94.5 93.1  ?PLT 217 208 193  ? ? ?Basic Metabolic Panel: ?Recent Labs  ?Lab 12/09/21 ?1010 12/10/21 ?0647 12/11/21 ?[id number] 12/12/21 ?0938  ?NA 128* 136 140 140  ?K 3.8 3.7 3.3* 3.6  ?CL 86* 105 111 112*  ?CO2 27 18* 20* 21*  ?GLUCOSE 95 74 81 72  ?BUN 52* 36* 20 9  ?CREATININE 1.24 0.91 0.67 0.56*  ?CALCIUM 9.7 8.8* 8.9 8.3*  ? ? ?GFR: ?Estimated Creatinine Clearance: 57.6 mL/min (A) (by C-G formula based on SCr of 0.56 mg/dL (L)). ?Liver Function Tests: ?Recent Labs  ?Lab 12/09/21 ?1010 12/10/21 ?0647  ?AST 20 19  ?  ALT 18 18  ?ALKPHOS 93 82  ?BILITOT 1.0 1.5*  ?PROT 6.7 5.7*  ?ALBUMIN 3.9 3.1*  ? ? ?Recent Labs  ?Lab 12/09/21 ?1010  ?LIPASE 15  ? ? ?No results for input(s): AMMONIA in the last 168 hours. ?Coagulation Profile: ?No results for input(s): INR, PROTIME in the last 168 hours. ?Cardiac Enzymes: ?No results for input(s): CKTOTAL, CKMB, CKMBINDEX, TROPONINI in the last 168 hours. ?BNP (last 3 results) ?No results for input(s): PROBNP in the last 8760 hours. ?HbA1C: ?No results for input(s): HGBA1C in the last 72 hours. ?CBG: ?No results for input(s): GLUCAP in the last 168 hours. ?Lipid Profile: ?No results for input(s): CHOL, HDL, LDLCALC, TRIG, CHOLHDL, LDLDIRECT in the last 72 hours. ?Thyroid Function Tests: ?No results for input(s): TSH, T4TOTAL, FREET4, T3FREE, THYROIDAB in the last 72 hours. ?Anemia Panel: ?No results for input(s): VITAMINB12, FOLATE,  FERRITIN, TIBC, IRON, RETICCTPCT in the last 72 hours. ?Sepsis Labs: ?Recent Labs  ?Lab 12/10/21 ?[id number]  ?PROCALCITON 2.42  ? ? ? ?Recent Results (from the past 240 hour(s))  ?C Difficile Quick Screen w PCR reflex

## 2021-12-12 NOTE — Progress Notes (Signed)
Elijah Stanley Progress Note ? ?Elijah Stanley 26 y.o. August 19, 1995 ? ? ?Subjective: ?Mother reports that he will not eat still. Tolerated juice but nothing else. Diarrhea this morning. ? ?Objective: ?Vital signs: ?Vitals:  ? 12/12/21 0507 12/12/21 1416  ?BP: (!) 128/93 128/70  ?Pulse: 81 69  ?Resp: 16 16  ?Temp: (!) 97.4 ?F (36.3 ?C) (!) 97.3 ?F (36.3 ?C)  ?SpO2: 100% 100%  ? ? ?Physical Exam: ?Gen: lethargic, no acute distress, non-verbal ?HEENT: anicteric sclera ?CV: RRR ?Chest: CTA B ?Abd: upper quadrant tenderness with guarding, soft, nondistended, +BS, G-tube in place ?Ext: no edema ? ?Lab Results: ?Recent Labs  ?  12/11/21 ?3382 12/12/21 ?5053  ?NA 140 140  ?K 3.3* 3.6  ?CL 111 112*  ?CO2 20* 21*  ?GLUCOSE 81 72  ?BUN 20 9  ?CREATININE 0.67 0.56*  ?CALCIUM 8.9 8.3*  ? ?Recent Labs  ?  12/10/21 ?0647  ?AST 19  ?ALT 18  ?ALKPHOS 82  ?BILITOT 1.5*  ?PROT 5.7*  ?ALBUMIN 3.1*  ? ?Recent Labs  ?  12/10/21 ?0647 12/11/21 ?9767  ?WBC 10.5 8.2  ?NEUTROABS  --  4.4  ?HGB 13.1 11.5*  ?HCT 39.5 35.2*  ?MCV 94.5 93.1  ?PLT 208 193  ? ? ? ? ?Assessment/Plan: ?Question ileus vs partial SBO. Xray unrevealing. May need an updated CT if not able to tolerate nutrition. Ok to resume G-tube fluids as tolerated. Continue solids unless vomiting develops. Supportive care. ? ? ?Shirley Friar ?12/12/2021, 4:30 PM ? ?Questions please call [phone] Patient ID: Elijah Stanley, male   DOB: August 22, 1995, 26 y.o.   MRN: [id number] ? ?

## 2021-12-13 DIAGNOSIS — K529 Noninfective gastroenteritis and colitis, unspecified: Secondary | ICD-10-CM | POA: Diagnosis not present

## 2021-12-13 MED ORDER — LOPERAMIDE HCL 2 MG PO CAPS: 2.0000 mg | ORAL_CAPSULE | ORAL | Status: DC | PRN

## 2021-12-13 MED ORDER — FREE WATER
240.0000 mL | Freq: Every day | Status: DC
  Administered 2021-12-13 – 2021-12-14 (×5): 240 mL

## 2021-12-13 NOTE — Progress Notes (Addendum)
[facility] Progress Note ? ?Elijah Stanley 26 y.o. 07-18-1996 ? ?CC:  ileus vs partial SBO ? ? ?Subjective: ?Patient examined sitting comfortably in bed.  Patient accompanied by his mother.  Mother reports he is eating small amounts.  Have not attempted to resume G-tube fluids.  No vomiting. ? ?ROS : Review of Systems  ?Gastrointestinal:  Negative for abdominal pain, blood in stool, constipation, diarrhea, heartburn, melena, nausea and vomiting.  ?Genitourinary:  Negative for dysuria and urgency.  ?Neurological:  Negative for dizziness and tingling. \ ? ? ?Objective: ?Vital signs in last 24 hours: ?Vitals:  ? 12/12/21 1950 12/13/21 0604  ?BP: 124/83 124/68  ?Pulse: 88 77  ?Resp: 18 16  ?Temp: (!) 97.4 ?F (36.3 ?C) (!) 97.4 ?F (36.3 ?C)  ?SpO2: 100% 99%  ? ? ?Physical Exam: ? ?General:  Alert, cooperative, no distress, appears stated age  ?Head:  Normocephalic, without obvious abnormality, atraumatic  ?Eyes:  Anicteric sclera, EOM's intact  ?Lungs:   Clear to auscultation bilaterally, respirations unlabored  ?Heart:  Regular rate and rhythm, S1, S2 normal  ?Abdomen:   Soft, non-tender, bowel sounds active all four quadrants,  no masses, G-tube in place  ?Extremities: Extremities normal, atraumatic, no  edema  ?Pulses: 2+ and symmetric  ? ? ?Lab Results: ?Recent Labs  ?  12/11/21 ?6226 12/12/21 ?3335  ?NA 140 140  ?K 3.3* 3.6  ?CL 111 112*  ?CO2 20* 21*  ?GLUCOSE 81 72  ?BUN 20 9  ?CREATININE 0.67 0.56*  ?CALCIUM 8.9 8.3*  ? ?No results for input(s): AST, ALT, ALKPHOS, BILITOT, PROT, ALBUMIN in the last 72 hours. ?Recent Labs  ?  12/11/21 ?4562  ?WBC 8.2  ?NEUTROABS 4.4  ?HGB 11.5*  ?HCT 35.2*  ?MCV 93.1  ?PLT 193  ? ?No results for input(s): LABPROT, INR in the last 72 hours. ? ? ? ?Assessment ?Question ileus vs partial SBO.  ? ?Xray unrevealing.  ? ?No further episodes of vomiting.  Continues to have loose stools.  Patient's appetite is returning. ? ?Plan: ?Continue supportive care ?May resume G-tube  fluids as tolerated ?Continue solid food diet as tolerated ?Consider CT if unable to tolerate solids to meet nutritional standards. ?[facility] will sign off. ? ?Emmit Alexanders PA-C ?12/13/2021, 9:43 AM ? ?Contact #  [phone]  ?

## 2021-12-13 NOTE — Progress Notes (Signed)
?PROGRESS NOTE ? ? ? ?Elijah JumboMatthew J Steines  ZOX:[id number] DOB: May 13, 1996 DOA: 12/09/2021 ?PCP: Truett PernaWang, Yun, MD ? ? ?Brief Narrative:  ?HPI: ?Elijah Stanley is a 26 y.o. male with medical history significant for agenesis of corpus callosum, chromosome 1 deletion, developmental delay, left-sided hemiplegic cerebral palsy, central hypothyroidism, oropharyngeal dysphagia s/p G-tube, HTN, GAD, hearing loss and nonverbal status who presented to the ED for evaluation of vomiting and poor oral intake.  Patient is unable to provide history and is otherwise obtained by chart review and patient's mother at bedside. ? ?Patient has chronic oropharyngeal dysphagia with NG tube in place.  His mother states that he typically eats a soft diet by mouth and is given thin liquids through his G-tube.  He has required chronic use of MiraLAX to maintain regular bowel movements. ? ?2 days ago family noted that he appeared to be in apparent pain but was unable to indicate where his pain is located.  He then developed diarrhea followed by emesis.  They tried to keep up with his hydration by delivering fluids through his G-tube however again would develop diarrhea soon after. ? 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 ?  ?[facility] ED Course  Labs/Imaging on admission: I have personally reviewed following labs and imaging studies. ?  ?Initial vitals showed BP 130/90, pulse 122, RR 21, temp 98.0 ?F, SPO2 97% on room air. ?  ?Labs show sodium 128, potassium 3.8, chloride 86, bicarb 27, BUN 52, creatinine 1.24, serum glucose 95, LFTs within normal limits, WBC 10.3, hemoglobin 14.4, platelets 217,000, lipase 15. ?  ?Left femur x-ray negative for acute findings.  Atrophic thigh musculature noted.  Left tibia/fibula x-ray negative for fracture or bony lesions.  Osteopenia noted.  Left foot x-ray showed substantial deformity of the foot including severe pes cavus and  talonavicular varus without fracture. ?  ?Scrotal ultrasound showed right orchiectomy without abnormality of the left testicle. ?  ?CT abdomen/pelvis with contrast showed wall thickening and mucosal hyperenhancement diffusely throughout small bowel and within the rectum.  Findings favored to represent enterocolitis.  Low-grade partial SBO versus adynamic ileus also noted.  Chronic dilated distal esophagus with fluid within suggestive of dysmotility and/or Cresto esophageal reflux reported. ?  ?Patient was given 1 L normal saline, IV Toradol, IV Zofran.  EDP initially discussed with on-call general surgery who recommended GI consult.  [facility], Dr. Levora AngelBrahmbhatt, was consulted and recommended medical admission and they will see in the morning.  The hospitalist service was consulted to admit for further evaluation and management. ? ?Assessment & Plan: ?  ?Principal Problem: ?  Enterocolitis ?Active Problems: ?  Ileus (HCC) ?  Hyponatremia ?  Central hypothyroidism ?  Generalized anxiety disorder ?  Hypertension ?  Left-sided hemiplegic cerebral palsy (HCC) ? ?Enterocolitis/Adynamic ileus versus partial SBO ?Findings of enterocolitis and adynamic ileus versus low-grade partial SBO seen on CT imaging.  Has chronic oropharyngeal dysphagia with G-tube in place to use as needed.  ED physician discussed his situation with general surgery on-call who did not think that patient has small bowel obstruction and defer admission to hospitalist service.  GI is on board.  Per father was at the bedside, patient has been tolerating soft diet but has been having significant diarrhea since yesterday.  C. difficile is negative.  GI pathogen panel is negative as well.  Patient has been started on regular diet but according to mother, he is  taking small bites.  He still having diarrhea.  I will now start him on Imodium as needed.  We will also resume his fluids from G-tube. Repeat abdominal x-ray does not show any bowel obstruction  anymore. ? ?Hyponatremia: Resolved. ? ?Hypokalemia: Resolved. ?  ?Left-sided hemiplegic cerebral palsy (HCC) ?Chronic issue, stable.  No evidence of acute fracture/abnormality on left lower extremity x-rays. ?  ?Hypertension: Blood pressure fairly stable. ? ?Sinus tachycardia: Resolved.  Continue metoprolol. ?  ?Generalized anxiety disorder: Continue home medications. ?  ?Central hypothyroidism: Continue home medication. ? ?DVT prophylaxis:  ?  Code Status: Full Code  ?Family Communication: Mother present at bedside.  Plan of care discussed with her in length. ?Status is: Inpatient ?Remains inpatient appropriate because: Still with diarrhea. ? ? ? ? ?Estimated body mass index is 25.9 kg/m? as calculated from the following: ?  Height as of this encounter: 4' (1.219 m). ?  Weight as of this encounter: 38.5 kg. ? ?  ?Nutritional Assessment: ?Body mass index is 25.9 kg/m?Marland KitchenMarland Kitchen ?Seen by dietician.  I agree with the assessment and plan as outlined below: ?Nutrition Status: ?  ?  ?  ? ?. ?Skin Assessment: ?I have examined the patient's skin and I agree with the wound assessment as performed by the wound care RN as outlined below: ?  ? ?Consultants:  ?GI ? ?Procedures:  ?None ? ?Antimicrobials:  ?Anti-infectives (From admission, onward)  ? ? None  ? ?  ?  ? ? ?Subjective: ? ?Patient seen and examined.  Nonverbal.  Appears comfortable.  Mother at the bedside. ? ?Objective: ?Vitals:  ? 12/12/21 0515 12/12/21 1416 12/12/21 1950 12/13/21 0604  ?BP:  128/70 124/83 124/68  ?Pulse:  69 88 77  ?Resp:  16 18 16   ?Temp:  (!) 97.3 ?F (36.3 ?C) (!) 97.4 ?F (36.3 ?C) (!) 97.4 ?F (36.3 ?C)  ?TempSrc:  Oral Oral Oral  ?SpO2:  100% 100% 99%  ?Weight: 39.2 kg   38.5 kg  ?Height:      ? ? ?Intake/Output Summary (Last 24 hours) at 12/13/2021 1152 ?Last data filed at 12/13/2021 0600 ?Gross per 24 hour  ?Intake 1724.34 ml  ?Output --  ?Net 1724.34 ml  ? ? ?Filed Weights  ? 12/09/21 0845 12/12/21 0515 12/13/21 0604  ?Weight: 35.4 kg 39.2 kg 38.5 kg   ? ? ?Examination: ? ?General exam: Appears calm and comfortable  ?Respiratory system: Clear to auscultation. Respiratory effort normal. ?Cardiovascular system: S1 & S2 heard, RRR. No JVD, murmurs, rubs, gallops or clicks. No pedal edema. ?Gastrointestinal system: Abdomen is nondistended, soft and nontender. No organomegaly or masses felt. Normal bowel sounds heard. ?Central nervous system: Alert but not oriented. ? ?Data Reviewed: I have personally reviewed following labs and imaging studies ? ?CBC: ?Recent Labs  ?Lab 12/09/21 ?1010 12/10/21 ?0647 12/11/21 ?12/13/21  ?WBC 10.3 10.5 8.2  ?NEUTROABS 7.7  --  4.4  ?HGB 14.4 13.1 11.5*  ?HCT 40.3 39.5 35.2*  ?MCV 85.6 94.5 93.1  ?PLT 217 208 193  ? ? ?Basic Metabolic Panel: ?Recent Labs  ?Lab 12/09/21 ?1010 12/10/21 ?0647 12/11/21 ?12/13/21 12/12/21 ?12/14/21  ?NA 128* 136 140 140  ?K 3.8 3.7 3.3* 3.6  ?CL 86* 105 111 112*  ?CO2 27 18* 20* 21*  ?GLUCOSE 95 74 81 72  ?BUN 52* 36* 20 9  ?CREATININE 1.24 0.91 0.67 0.56*  ?CALCIUM 9.7 8.8* 8.9 8.3*  ? ? ?GFR: ?Estimated Creatinine Clearance: 57 mL/min (A) (by C-G formula based on SCr of 0.56  mg/dL (L)). ?Liver Function Tests: ?Recent Labs  ?Lab 12/09/21 ?1010 12/10/21 ?0647  ?AST 20 19  ?ALT 18 18  ?ALKPHOS 93 82  ?BILITOT 1.0 1.5*  ?PROT 6.7 5.7*  ?ALBUMIN 3.9 3.1*  ? ? ?Recent Labs  ?Lab 12/09/21 ?1010  ?LIPASE 15  ? ? ?No results for input(s): AMMONIA in the last 168 hours. ?Coagulation Profile: ?No results for input(s): INR, PROTIME in the last 168 hours. ?Cardiac Enzymes: ?No results for input(s): CKTOTAL, CKMB, CKMBINDEX, TROPONINI in the last 168 hours. ?BNP (last 3 results) ?No results for input(s): PROBNP in the last 8760 hours. ?HbA1C: ?No results for input(s): HGBA1C in the last 72 hours. ?CBG: ?No results for input(s): GLUCAP in the last 168 hours. ?Lipid Profile: ?No results for input(s): CHOL, HDL, LDLCALC, TRIG, CHOLHDL, LDLDIRECT in the last 72 hours. ?Thyroid Function Tests: ?No results for input(s): TSH, T4TOTAL,  FREET4, T3FREE, THYROIDAB in the last 72 hours. ?Anemia Panel: ?No results for input(s): VITAMINB12, FOLATE, FERRITIN, TIBC, IRON, RETICCTPCT in the last 72 hours. ?Sepsis Labs: ?Recent Labs  ?Lab 12/10/21 ?0647  ?PROC

## 2021-12-14 DIAGNOSIS — K529 Noninfective gastroenteritis and colitis, unspecified: Secondary | ICD-10-CM | POA: Diagnosis not present

## 2021-12-14 NOTE — Discharge Summary (Signed)
PatientPhysician Discharge Summary  ?Elijah JumboMatthew J Poznanski ION:[id number] DOB: 07-11-1996 DOA: 12/09/2021 ? ?PCP: Truett PernaWang, Yun, MD ? ?Admit date: 12/09/2021 ?Discharge date: 12/14/2021 ?30 Day Unplanned Readmission Risk Score   ? ?Flowsheet Row ED to Hosp-Admission (Current) from 12/09/2021 in [facility]  ?30 Day Unplanned Readmission Risk Score (%) 8.96 Filed at 12/14/2021 0801  ? ?  ? ? This score is the patient's risk of an unplanned readmission within 30 days of being discharged (0 -100%). The score is based on dignosis, age, lab data, medications, orders, and past utilization.   ?Low:  0-14.9   Medium: 15-21.9   High: 22-29.9   Extreme: 30 and above ? ?  ? ?  ? ? ? ?Admitted From: Home ?Disposition: Home ? ?Recommendations for Outpatient Follow-up:  ?Follow up with PCP in 1-2 weeks ?Please obtain BMP/CBC in one week ?Please follow up with your PCP on the following pending results: ?Unresulted Labs (From admission, onward)  ? ?  Start     Ordered  ? 12/09/21 0928  Urinalysis, Routine w reflex microscopic  Once,   R       ? 12/09/21 0929  ? ?  ?  ? ?  ?  ? ? ?Home Health: None ?Equipment/Devices: None ? ?Discharge Condition: Stable ?CODE STATUS: Full code ?Diet recommendation: Regular ? ?Subjective: Seen and examined.  Patient nonverbal.  Mother at the bedside.  Patient appears to be very happy and looks much better and mother also agrees with that.  She is comfortable with patient being discharged today.  He is tolerating the diet.  Has had only 2 bowel movements in last 24 hours. ? ?Brief/Interim Summary: Elijah Stanley is a 26 y.o. male with medical history significant for agenesis of corpus callosum, chromosome 1 deletion, developmental delay, left-sided hemiplegic cerebral palsy, central hypothyroidism, oropharyngeal dysphagia s/p G-tube, HTN, GAD, hearing loss and nonverbal status who presented to the ED for evaluation of vomiting and poor oral intake.  Patient is unable to provide history and is  otherwise obtained by chart review and patient's mother at bedside. ? ?Patient has chronic oropharyngeal dysphagia with G tube in place.  His mother states that he typically eats a soft diet by mouth and is given thin liquids through his G-tube.  He has required chronic use of MiraLAX to maintain regular bowel movements. ? ?2 days ago family noted that he appeared to be in apparent pain but was unable to indicate where his pain is located.  He then developed diarrhea followed by emesis.  They tried to keep up with his hydration by delivering fluids through his G-tube however again would develop diarrhea soon after. ? 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 ?  ?Upon arrival to ED, he was fairly hemodynamically stable. ?Left femur x-ray negative for acute findings.  Atrophic thigh musculature noted.  Left tibia/fibula x-ray negative for fracture or bony lesions.  Osteopenia noted.  Left foot x-ray showed substantial deformity of the foot including severe pes cavus and talonavicular varus without fracture. ? Scrotal ultrasound showed right orchiectomy without abnormality of the left testicle. CT abdomen/pelvis with contrast showed wall thickening and mucosal hyperenhancement diffusely throughout small bowel and within the rectum.  Findings favored to represent enterocolitis.  Low-grade partial SBO versus adynamic ileus also noted.  Chronic dilated distal esophagus with fluid within suggestive of dysmotility and/or Cresto esophageal reflux reported. Patient was given 1 L normal saline,  IV Toradol, IV Zofran.  EDP initially discussed with on-call general surgery who recommended GI consult.  [facility], Dr. Levora Angel, was consulted and recommended medical admission.  ? ?Enterocolitis/Adynamic ileus versus partial SBO:  ?Findings of enterocolitis and adynamic ileus versus low-grade partial SBO seen on CT imaging.  Has chronic oropharyngeal dysphagia  with G-tube in place to use as needed.  ED physician discussed his situation with general surgery on-call who did not think that patient has small bowel obstruction and defer admission to hospitalist service.  GI saw him and they recommended supportive/conservative care.  He was initially n.p.o. and his diet was started on clears and has been advanced to regular diet in the last few days.  He is now tolerating diet.  In the interim, he also developed diarrhea.  He was ruled out of C. difficile and GI pathogen panel.  His diarrhea improved by itself.  His G-tube fluids have been resumed yesterday as well.  Per mother who is at the bedside, patient is now doing much better and is back to baseline and she would like for him to be discharged.  Patient also appears to be happy today.  He is nonverbal at baseline.  Repeat abdominal x-ray does not show any ileus or small bowel obstruction. ?  ?Hyponatremia: Resolved. ?  ?Hypokalemia: Resolved. ?  ?Hypertension: Blood pressure fairly stable. ?  ?Sinus tachycardia: Resolved.  Continue metoprolol. ? ?Discharge plan was discussed with patient and/or family member and they verbalized understanding and agreed with it.  ?Discharge Diagnoses:  ?Principal Problem: ?  Enterocolitis ?Active Problems: ?  Ileus (HCC) ?  Hyponatremia ?  Central hypothyroidism ?  Generalized anxiety disorder ?  Hypertension ?  Left-sided hemiplegic cerebral palsy (HCC) ? ? ? ?Discharge Instructions ? ? ?Allergies as of 12/14/2021   ? ?   Reactions  ? Rocephin [ceftriaxone Sodium In Dextrose] Rash  ? ?  ? ?  ?Medication List  ?  ? ?TAKE these medications   ? ?albuterol (2.5 MG/3ML) 0.083% nebulizer solution ?Commonly known as: PROVENTIL ?Inhale 3 mLs into the lungs as needed for wheezing or shortness of breath. ?  ?carbamide peroxide 6.5 % OTIC solution ?Commonly known as: DEBROX ?Place 5 drops into both ears as needed. For ear wax ?  ?desvenlafaxine 25 MG 24 hr tablet ?Commonly known as: PRISTIQ ?Take 1  tablet by mouth daily. ?  ?levothyroxine 50 MCG tablet ?Commonly known as: SYNTHROID ?Take 50 mcg by mouth daily before breakfast. 25 mcg on Saturdays and Sundays ?  ?metoprolol tartrate 25 MG tablet ?Commonly known as: LOPRESSOR ?Take 12.5 mg by mouth 2 (two) times daily. ?  ?multivitamin Chew chewable tablet ?Chew 1 tablet by mouth daily. ?  ?nystatin powder ?Commonly known as: MYCOSTATIN/NYSTOP ?Apply a small amount topically to affected area 2-3x daily as needed for G-Tube site irritation ?  ?OXYGEN ?Concentrator to keep O2 sats > 92% as needed ?  ?polyethylene glycol 17 g packet ?Commonly known as: MIRALAX / GLYCOLAX ?Take 17 g by mouth daily. ?  ?triamcinolone ointment 0.1 % ?Commonly known as: KENALOG ?Apply a small amount to affected area 2-3 times daily as needed around G-tube. ?  ? ?  ? ? Follow-up Information   ? ? Truett Perna, MD Follow up in 1 week(s).   ?Specialty: Internal Medicine ?Contact information: ?[address] ?[address] ?[place] [address] ?[phone] ? ? ?  ?  ? ?  ?  ? ?  ? ?Allergies  ?Allergen Reactions  ?  Rocephin [Ceftriaxone Sodium In Dextrose] Rash  ? ? ?Consultations: GI ? ? ?Procedures/Studies: ?DG Tibia/Fibula Left ? ?Result Date: 12/09/2021 ?CLINICAL DATA:  Patient with cerebral palsy, nonverbal with pain at the left testicle. His left lower extremity stays in fixed flexed position with inability to fully extend EXAM: LEFT TIBIA AND FIBULA - 2 VIEW COMPARISON:  No previous studies available for comparison FINDINGS: There is no evidence of fracture or other focal bone lesions. There is fixed plantar flexion of likely contracture seen at the ankle joint. Osteopenia. Moderate soft tissue swelling at the dorsum of the foot. IMPRESSION: No fracture or focal bony lesions. Osteopenia. Likely contracture in the plantar flexion at the ankle joint. Electronically Signed   By: Marjo Bicker M.D.   On: 12/09/2021 11:34  ? ?US Scrotum ? ?Result Date: 12/09/2021 ?CLINICAL DATA:  Left  testicular pain EXAM: SCROTAL ULTRASOUND DOPPLER ULTRASOUND OF THE TESTICLES TECHNIQUE: Complete ultrasound examination of the testicles, epididymis, and other scrotal structures was performed. Color and spectral Do

## 2022-05-14 IMAGING — CT CT ABD-PELV W/ CM
2 of 7 series · 8 of 46 positions shown, 9 images · IV contrast (Omnipaque)
Comparison: 09/04/2020 CT.

CLINICAL DATA: Nausea and vomiting. History of cerebral palsy.
History of feeding tube. Hernia repair.

EXAM:
CT ABDOMEN AND PELVIS WITH CONTRAST
TECHNIQUE: Multidetector CT imaging of the abdomen and pelvis was performed
using the standard protocol following bolus administration of
intravenous contrast.

[Series 2: axial st · axial · 0.74mm/px · z∈[-324,-28]mm · 5 of 81 slices shown, 6 images]
[im 11/81  soft-tissue]
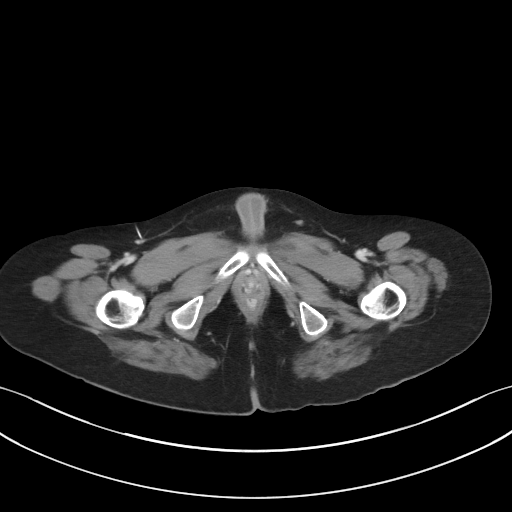
[im 11/81  bone]
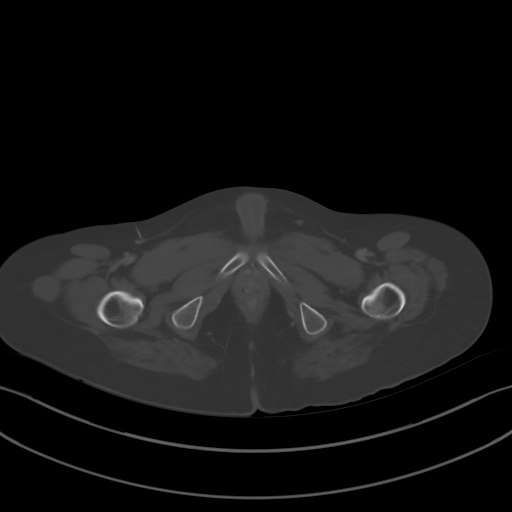
[im 26/81  soft-tissue]
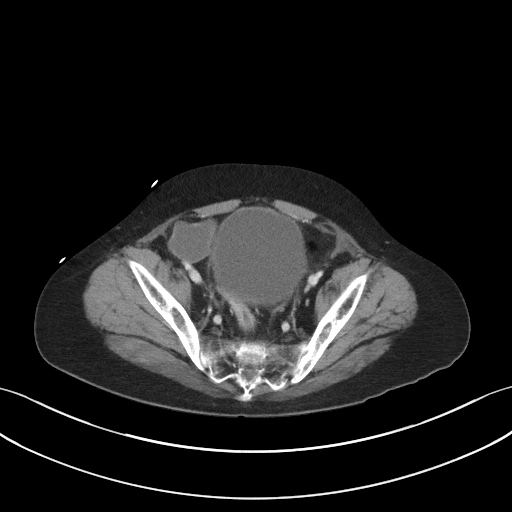
[im 41/81  soft-tissue]
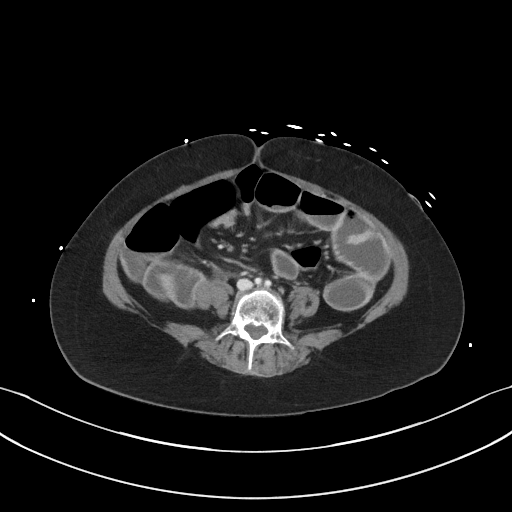
[im 55/81  soft-tissue]
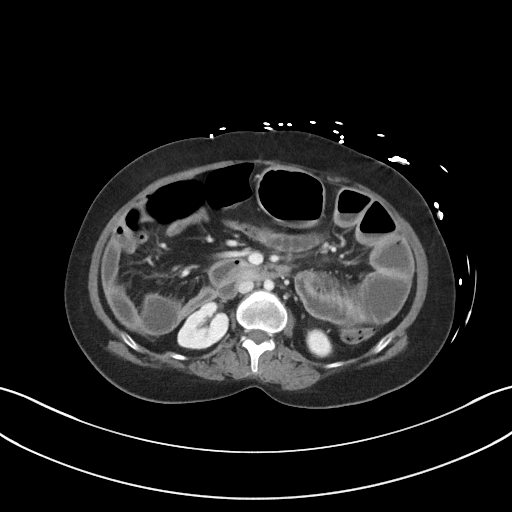
[im 70/81  soft-tissue]
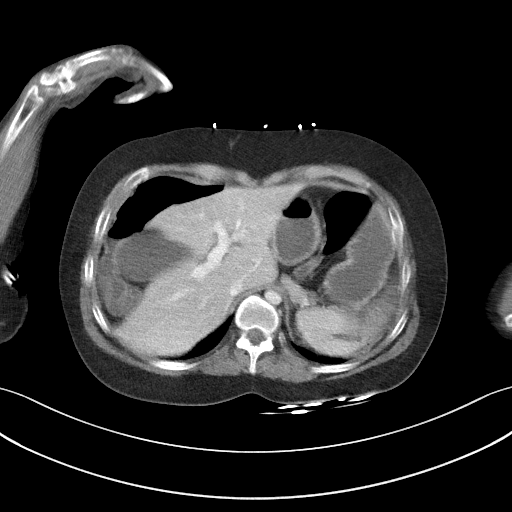

[Series 10: coronal st · coronal · 0.13mm/px · 3 of 71 slices shown]
[im 18/71  soft-tissue]
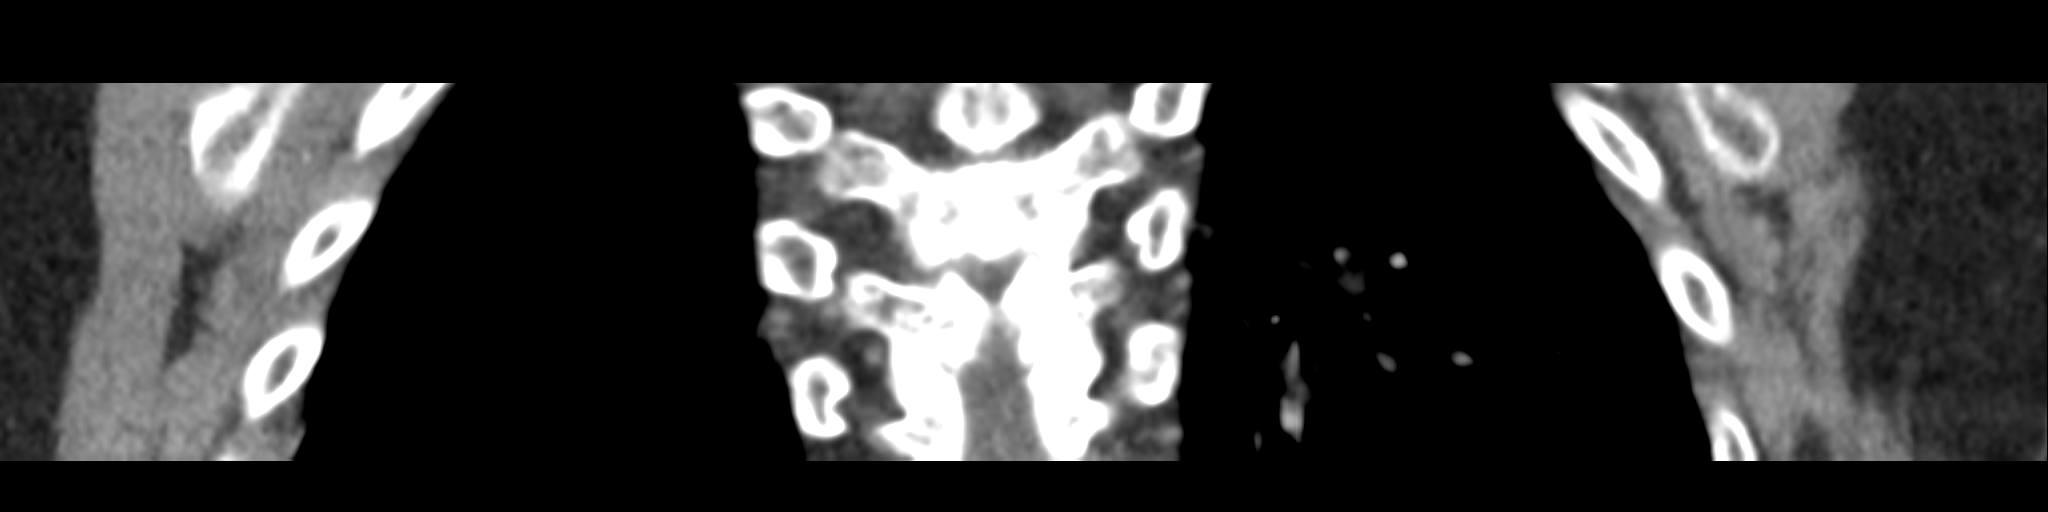
[im 36/71  soft-tissue]
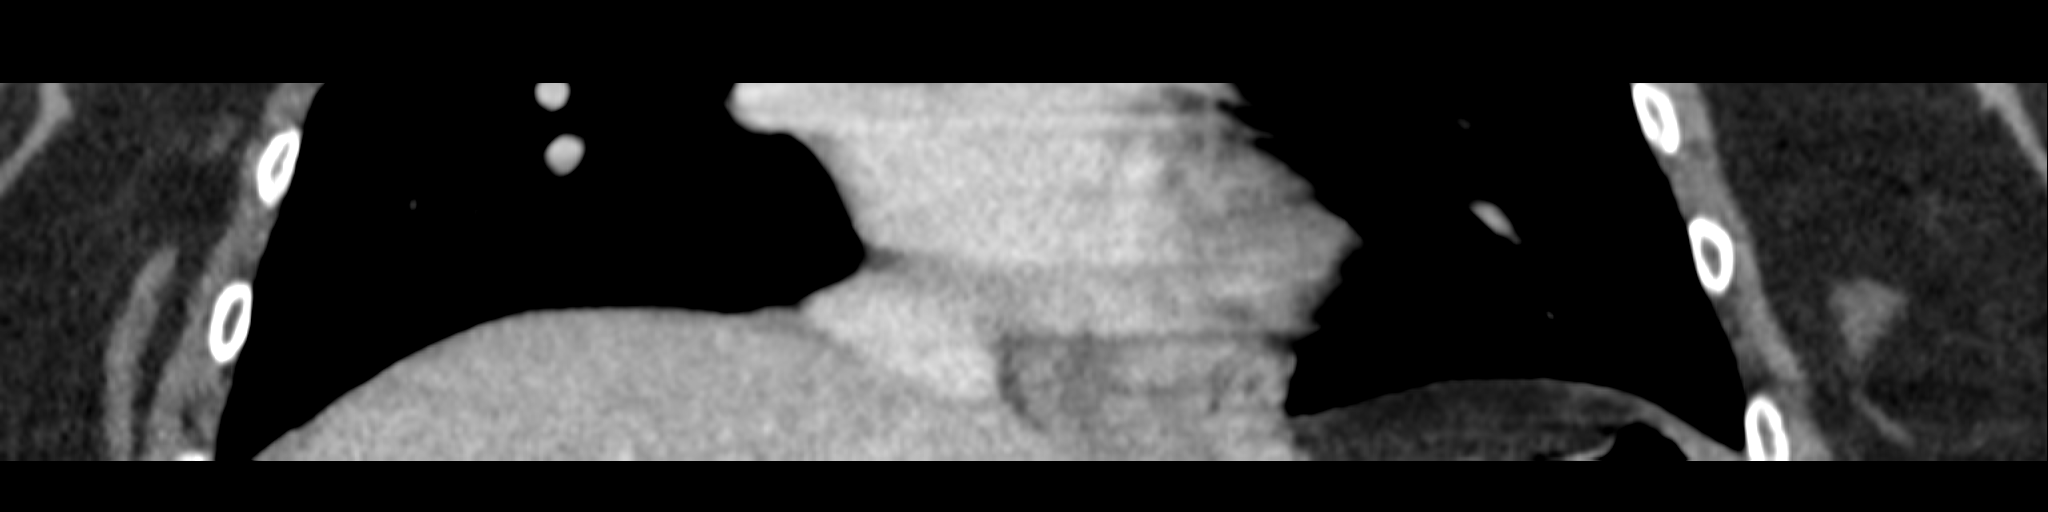
[im 53/71  soft-tissue]
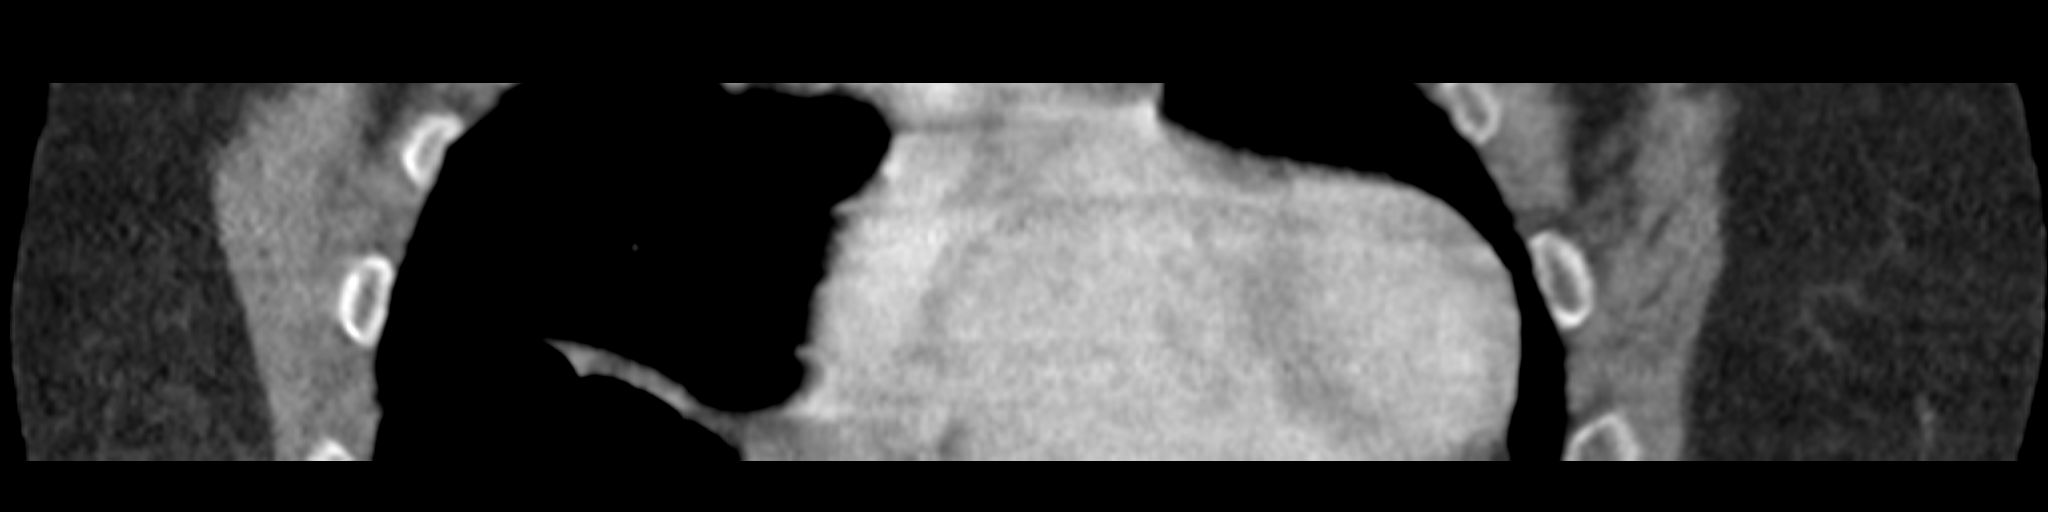

[8 of 46 positions shown; findings below may reference images not displayed]

RADIATION DOSE REDUCTION: This exam was performed according to the
departmental dose-optimization program which includes automated
exposure control, adjustment of the mA and/or kV according to
patient size and/or use of iterative reconstruction technique.

CONTRAST:  75mL OMNIPAQUE IOHEXOL 300 MG/ML  SOLN
FINDINGS: Lower chest: Left greater than right base airspace and nodular
opacities. Normal heart size without pericardial or pleural
effusion. The esophagus is again dilated and fluid-filled with
surgical changes at the gastroesophageal junction.

Hepatobiliary: Degradation secondary to patient arm position and
minimal motion. Normal liver. Mild gallbladder distension, without
calcified stone or acute cholecystitis.

Pancreas: Normal, without mass or ductal dilatation.

Spleen: Normal in size, without focal abnormality.

Adrenals/Urinary Tract: Normal adrenal glands. Suspect mild renal
cortical thinning for age. An interpolar left renal too small to
characterize lesion at 6 mm is favored to represent a hemorrhagic
cyst, given precontrast hyperattenuation on the prior exam.

Normal urinary bladder.

Stomach/Bowel: Gastrostomy tube appropriately position.

The rectum is mildly thickened with mucosal hyperenhancement on
63/2. More proximal colon is relatively normal in caliber with
portions appearing decompressed.

Wall thickening and mucosal hyperenhancement are relatively mild,
identified throughout the small bowel but most significant in the
jejunum. Concurrent prominence of the Vasa recta. Example 42/2.

No focal transition identified, with small bowel followed to the
level of the ileocecal junction.

Vascular/Lymphatic: Normal caliber of the aorta and branch vessels.
No abdominopelvic adenopathy.

Reproductive: Normal prostate.

Other: No significant free fluid. No pneumatosis or free
intraperitoneal air.

Musculoskeletal: No acute osseous abnormality.
IMPRESSION: 1. Wall thickening and mucosal hyperenhancement relatively diffusely
throughout the small bowel and within the rectum. Findings are
favored to represent inflammatory or infectious enterocolitis.
Correlate with clinical history to suggest inflammatory bowel
disease.
2. Mildly dilated small bowel throughout, without focal transition
identified. Most likely adynamic ileus. Low-grade partial small
bowel obstruction could look similar.
3. Left greater than right base airspace disease which is suspicious
for aspiration or less likely infection, given clinical history.
4. Mild limitations as detailed above.
5. Chronic dilated distal esophagus with fluid within. Findings
suggest dysmotility and/or gastroesophageal reflux.

## 2022-05-17 IMAGING — DX DG ABD PORTABLE 1V
1 series · 1 of 1 positions shown · non-contrast
Comparison: 12/10/2021

CLINICAL DATA: Small-bowel obstruction

EXAM:
PORTABLE ABDOMEN - 1 VIEW

[abdomen kub]
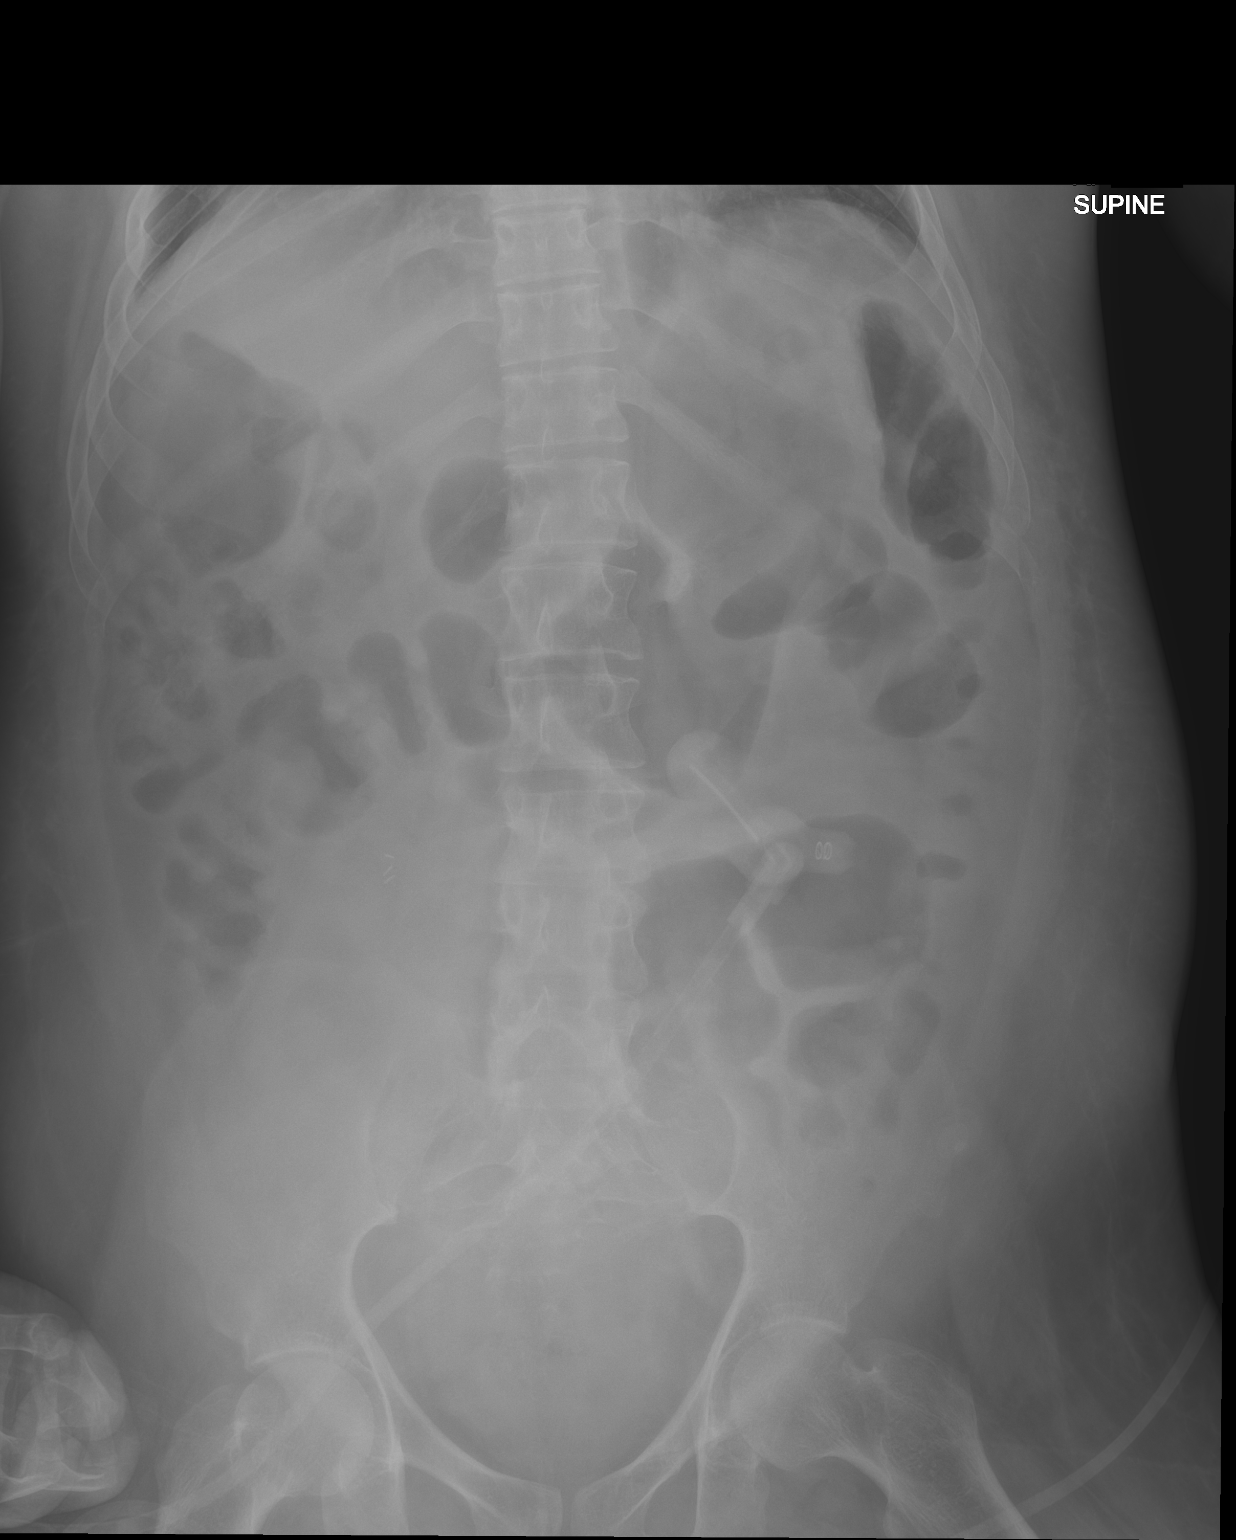

[1 of 1 positions shown; findings below may reference images not displayed]

FINDINGS: Gastrostomy tube projects over the stomach. Air is present within
the colon. No dilated loops of small bowel. No gross free
intraperitoneal air on supine view.
IMPRESSION: Nonobstructive bowel gas pattern.

## 2022-06-28 ENCOUNTER — Emergency Department (HOSPITAL_BASED_OUTPATIENT_CLINIC_OR_DEPARTMENT_OTHER): Payer: Medicaid Other

## 2022-06-28 ENCOUNTER — Encounter (HOSPITAL_BASED_OUTPATIENT_CLINIC_OR_DEPARTMENT_OTHER): Payer: Self-pay | Admitting: Emergency Medicine

## 2022-06-28 ENCOUNTER — Emergency Department (HOSPITAL_BASED_OUTPATIENT_CLINIC_OR_DEPARTMENT_OTHER)
Admission: EM | Admit: 2022-06-28 | Discharge: 2022-06-28 | Disposition: A | Discharge status: Home / Self Care | Payer: Medicaid Other | Attending: Emergency Medicine | Admitting: Emergency Medicine

## 2022-06-28 DIAGNOSIS — S0081XA Abrasion of other part of head, initial encounter: Secondary | ICD-10-CM | POA: Insufficient documentation

## 2022-06-28 DIAGNOSIS — S40011A Contusion of right shoulder, initial encounter: Secondary | ICD-10-CM | POA: Insufficient documentation

## 2022-06-28 DIAGNOSIS — Z79899 Other long term (current) drug therapy: Secondary | ICD-10-CM | POA: Insufficient documentation

## 2022-06-28 DIAGNOSIS — W16112A Fall into natural body of water striking water surface causing other injury, initial encounter: Secondary | ICD-10-CM | POA: Insufficient documentation

## 2022-06-28 DIAGNOSIS — S0993XA Unspecified injury of face, initial encounter: Secondary | ICD-10-CM | POA: Diagnosis present

## 2022-06-28 DIAGNOSIS — W19XXXA Unspecified fall, initial encounter: Secondary | ICD-10-CM
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
## 2022-06-28 NOTE — Discharge Instructions (Signed)
Elijah Stanley's x-ray was clear today.  His exam is reassuring.  Please continue good wound care on the abrasions.  Return to the emergency department if you notice worsening cough, shortness of breath, blue or purple tint to the lips or skin, increased work of breathing.  Follow-up with the PCP with any concerns if they develop over the next several days.

## 2022-06-28 NOTE — ED Notes (Signed)
Patient transported to X-ray 

## 2022-06-28 NOTE — ED Notes (Signed)
Pt has returned to their room 

## 2023-08-01 ENCOUNTER — Emergency Department (HOSPITAL_COMMUNITY): Payer: MEDICAID

## 2023-08-01 ENCOUNTER — Inpatient Hospital Stay (HOSPITAL_COMMUNITY)
Admission: EM | Admit: 2023-08-01 | Discharge: 2023-08-03 | DRG: 100 | Disposition: A | Discharge status: Home / Self Care | Payer: MEDICAID | Attending: Internal Medicine | Admitting: Internal Medicine

## 2023-08-01 DIAGNOSIS — R55 Syncope and collapse: Principal | ICD-10-CM | POA: Diagnosis present

## 2023-08-01 DIAGNOSIS — J690 Pneumonitis due to inhalation of food and vomit: Secondary | ICD-10-CM | POA: Diagnosis present

## 2023-08-01 DIAGNOSIS — Q040 Congenital malformations of corpus callosum: Secondary | ICD-10-CM

## 2023-08-01 DIAGNOSIS — Q9359 Other deletions of part of a chromosome: Secondary | ICD-10-CM

## 2023-08-01 DIAGNOSIS — R1312 Dysphagia, oropharyngeal phase: Secondary | ICD-10-CM | POA: Diagnosis present

## 2023-08-01 DIAGNOSIS — E038 Other specified hypothyroidism: Secondary | ICD-10-CM | POA: Diagnosis present

## 2023-08-01 DIAGNOSIS — Z79899 Other long term (current) drug therapy: Secondary | ICD-10-CM

## 2023-08-01 DIAGNOSIS — R000 Tachycardia, unspecified: Secondary | ICD-10-CM

## 2023-08-01 DIAGNOSIS — R6250 Unspecified lack of expected normal physiological development in childhood: Secondary | ICD-10-CM | POA: Diagnosis present

## 2023-08-01 DIAGNOSIS — G808 Other cerebral palsy: Secondary | ICD-10-CM | POA: Diagnosis present

## 2023-08-01 DIAGNOSIS — Z931 Gastrostomy status: Secondary | ICD-10-CM

## 2023-08-01 DIAGNOSIS — G40909 Epilepsy, unspecified, not intractable, without status epilepticus: Principal | ICD-10-CM

## 2023-08-01 DIAGNOSIS — R230 Cyanosis: Secondary | ICD-10-CM | POA: Diagnosis present

## 2023-08-01 DIAGNOSIS — Z888 Allergy status to other drugs, medicaments and biological substances status: Secondary | ICD-10-CM

## 2023-08-01 DIAGNOSIS — Z79890 Hormone replacement therapy (postmenopausal): Secondary | ICD-10-CM

## 2023-08-01 DIAGNOSIS — H9193 Unspecified hearing loss, bilateral: Secondary | ICD-10-CM | POA: Diagnosis present

## 2023-08-01 DIAGNOSIS — Z993 Dependence on wheelchair: Secondary | ICD-10-CM

## 2023-08-01 DIAGNOSIS — I10 Essential (primary) hypertension: Secondary | ICD-10-CM | POA: Diagnosis present

## 2023-08-01 LAB — CBC WITH DIFFERENTIAL/PLATELET
Abs Immature Granulocytes: 0.02 10*3/uL (ref 0.00–0.07)
Basophils Absolute: 0 10*3/uL (ref 0.0–0.1)
Basophils Relative: 0 %
Eosinophils Absolute: 0 10*3/uL (ref 0.0–0.5)
Eosinophils Relative: 0 %
HCT: 39.1 % (ref 39.0–52.0)
Hemoglobin: 13.1 g/dL (ref 13.0–17.0)
Immature Granulocytes: 0 %
Lymphocytes Relative: 41 %
Lymphs Abs: 2.9 10*3/uL (ref 0.7–4.0)
MCH: 30.2 pg (ref 26.0–34.0)
MCHC: 33.5 g/dL (ref 30.0–36.0)
MCV: 90.1 fL (ref 80.0–100.0)
Monocytes Absolute: 1 10*3/uL (ref 0.1–1.0)
Monocytes Relative: 14 %
Neutro Abs: 3.3 10*3/uL (ref 1.7–7.7)
Neutrophils Relative %: 45 %
Platelets: 189 10*3/uL (ref 150–400)
RBC: 4.34 MIL/uL (ref 4.22–5.81)
RDW: 13.5 % (ref 11.5–15.5)
WBC: 7.3 10*3/uL (ref 4.0–10.5)
nRBC: 0 % (ref 0.0–0.2)

## 2023-08-01 LAB — COMPREHENSIVE METABOLIC PANEL
ALT: 28 U/L (ref 0–44)
AST: 39 U/L (ref 15–41)
Albumin: 3.9 g/dL (ref 3.5–5.0)
Alkaline Phosphatase: 89 U/L (ref 38–126)
Anion gap: 8 (ref 5–15)
BUN: 19 mg/dL (ref 6–20)
CO2: 29 mmol/L (ref 22–32)
Calcium: 9.6 mg/dL (ref 8.9–10.3)
Chloride: 100 mmol/L (ref 98–111)
Creatinine, Ser: 0.73 mg/dL (ref 0.61–1.24)
GFR, Estimated: >60 mL/min (ref >60)
Glucose, Bld: 154 mg/dL — ABNORMAL HIGH (ref 70–99)
Potassium: 4.3 mmol/L (ref 3.5–5.1)
Sodium: 137 mmol/L (ref 135–145)
Total Bilirubin: 0.6 mg/dL (ref <1.2)
Total Protein: 6.6 g/dL (ref 6.5–8.1)

## 2023-08-01 LAB — I-STAT CG4 LACTIC ACID, ED
Lactic Acid, Venous: 0.6 mmol/L (ref 0.5–1.9)
Lactic Acid, Venous: 1.2 mmol/L (ref 0.5–1.9)

## 2023-08-01 LAB — BRAIN NATRIURETIC PEPTIDE: B Natriuretic Peptide: 27.3 pg/mL (ref 0.0–100.0)

## 2023-08-01 LAB — D-DIMER, QUANTITATIVE: D-Dimer, Quant: 1.68 ug{FEU}/mL — ABNORMAL HIGH (ref 0.00–0.50)

## 2023-08-01 LAB — TROPONIN I (HIGH SENSITIVITY): Troponin I (High Sensitivity): 10 ng/L (ref <18)

## 2023-08-01 MED ORDER — IOHEXOL 350 MG/ML SOLN
60.0000 mL | Freq: Once | INTRAVENOUS | Status: AC | PRN
  Administered 2023-08-01: 50 mL via INTRAVENOUS

## 2023-08-01 MED ORDER — LEVETIRACETAM IN NACL 1000 MG/100ML IV SOLN
1000.0000 mg | INTRAVENOUS | Status: AC
Start: 2023-08-01 — End: 2023-08-01
  Administered 2023-08-01 (×2): 1000 mg via INTRAVENOUS
  Filled 2023-08-01 (×2): qty 100

## 2023-08-01 MED ORDER — DIPHENHYDRAMINE HCL 50 MG/ML IJ SOLN: 25.0000 mg | Freq: Once | INTRAMUSCULAR | Status: DC | PRN

## 2023-08-01 MED ORDER — SODIUM CHLORIDE 0.9 % IV SOLN: 60.0000 mg/kg | Freq: Once | INTRAVENOUS | Status: DC

## 2023-08-01 NOTE — ED Notes (Signed)
Unable to get labs ?

## 2023-08-01 NOTE — ED Notes (Signed)
IV team at bedside now

## 2023-08-01 NOTE — ED Provider Notes (Signed)
[place] EMERGENCY DEPARTMENT AT [facility] Provider Note   CSN: [id number] Arrival date & time: 08/01/23  1401     History  Chief Complaint  Patient presents with   Seizures    Elijah Stanley is a 27 y.o. male.  HPI     27yo male with history of left-sided hemiplegic cerebral palsy, central hypothyroidism, chromosome 1 deletion syndrome, bilateral hearing loss, nonverbal, oropharyngeal dysphagia status post G-tube, agenesis of corpus callosum, seizure, hypertension presents with concern for episodes of becoming unresponsive and cyanotic.  Family at bedside reports that he had a history of partial seizures when he was an infant and was on antiepileptics at that time, but has been off of seizure medications since he was young and has not had any seizure-like activity.  Typically he uses his G-tube for liquids, and takes food by mouth.  Last night while they were eating dinner, they looked over and found him unresponsive, cyanotic and slumped over.  His dad performed the Heimlich maneuver on him and he coughed and woke up.  EMS arrived and offered transport to the hospital, however he had returned back to baseline, and there thought was that he had likely choked and aspirated and improved and elected not to come in.  Today, he had his food without issues and then his mom was giving him free water through his G-tube, when he suddenly became unresponsive.  He appeared blue in color, stiffened and had some jerking.  She is not sure exactly how long it lasted, maybe a minute.  He is now back to baseline again.  She denies any recent illness, no cough, fever, congestion, nausea, vomiting, diarrhea, black or bloody stools.  He has left-sided hemiaplasia, but no new numbness, weakness, facial droop or change in his behavior.      Past Medical History:  Diagnosis Date   Cerebral palsy (HCC)    Chromosome 1 deletion syndrome    Hearing loss    bilateral   [place] lymphedema     Thyroid disease    hypothyroidism   Unilateral weakness    Left     Home Medications Prior to Admission medications   Medication Sig Start Date End Date Taking? Authorizing Provider  albuterol (PROVENTIL) (2.5 MG/3ML) 0.083% nebulizer solution Inhale 3 mLs into the lungs as needed for wheezing or shortness of breath. 09/28/20  Yes [provider]  carbamide peroxide (DEBROX) 6.5 % OTIC solution Place 5 drops into both ears as needed. For ear wax 04/09/21  Yes [provider]  Desvenlafaxine Succinate ER 25 MG TB24 Take 1 tablet by mouth daily. 08/25/20  Yes [provider]  erythromycin ophthalmic ointment Place 1 Application into both eyes at bedtime. Apply a 0.5 inch strip to both eyes at bedtime for dry eye 01/30/23  Yes [provider]  levothyroxine (SYNTHROID, LEVOTHROID) 50 MCG tablet Take 50 mcg by mouth daily before breakfast. 25 mcg on Saturdays and Sundays   Yes [provider]  metoprolol tartrate (LOPRESSOR) 25 MG tablet Take 12.5 mg by mouth 2 (two) times daily. 09/13/21  Yes [provider]  multivitamin (VIT W/EXTRA C) CHEW chewable tablet Chew 1 tablet by mouth daily.   Yes [provider]  nystatin (MYCOSTATIN/NYSTOP) powder Apply a small amount topically to affected area 2-3x daily as needed for G-Tube site irritation 09/10/21  Yes [provider]  polyethylene glycol (MIRALAX / GLYCOLAX) packet 17 g at bedtime. Give via G-Tube   Yes [provider]  triamcinolone ointment (KENALOG) 0.1 % Apply a small amount to affected area 2-3 times daily as needed around G-tube. 09/10/21  Yes [provider]  OXYGEN Concentrator to keep O2 sats > 92% as needed    [provider]      Allergies    Rocephin [ceftriaxone sodium in dextrose]    Review of Systems   Review of Systems  Physical Exam Updated Vital Signs BP 120/73   Pulse (!) 106   Temp 98.6 F (37 C) (Axillary)   Resp 17    SpO2 100%  Physical Exam Vitals and nursing note reviewed.  Constitutional:      General: He is not in acute distress.    Appearance: He is well-developed. He is not diaphoretic.  HENT:     Head: Normocephalic and atraumatic.  Eyes:     Conjunctiva/sclera: Conjunctivae normal.  Cardiovascular:     Rate and Rhythm: Regular rhythm. Tachycardia present.     Heart sounds: Normal heart sounds. No murmur heard.    No friction rub. No gallop.  Pulmonary:     Effort: Pulmonary effort is normal. No respiratory distress.     Breath sounds: Normal breath sounds. No wheezing or rales.  Abdominal:     General: There is no distension.     Palpations: Abdomen is soft.     Tenderness: There is no abdominal tenderness. There is no guarding.  Musculoskeletal:     Cervical back: Normal range of motion.  Skin:    General: Skin is warm and dry.     Comments: Chronic discoloration of toes per family  Neurological:     Mental Status: He is alert.     Comments: Alert, active, vocalizing Spastic hemiplegia left     ED Results / Procedures / Treatments   Labs (all labs ordered are listed, but only abnormal results are displayed) Labs Reviewed  COMPREHENSIVE METABOLIC PANEL - Abnormal; Notable for the following components:      Result Value   Glucose, Bld 154 (*)    All other components within normal limits  D-DIMER, QUANTITATIVE - Abnormal; Notable for the following components:   D-Dimer, Quant 1.68 (*)    All other components within normal limits  CBC WITH DIFFERENTIAL/PLATELET  BRAIN NATRIURETIC PEPTIDE  URINALYSIS, W/ REFLEX TO CULTURE (INFECTION SUSPECTED)  RAPID URINE DRUG SCREEN, HOSP PERFORMED  CBG MONITORING, ED  I-STAT CG4 LACTIC ACID, ED  I-STAT CG4 LACTIC ACID, ED  TROPONIN I (HIGH SENSITIVITY)  TROPONIN I (HIGH SENSITIVITY)    EKG EKG Interpretation Date/Time:  Tuesday August 01 2023 14:14:18 EST Ventricular Rate:  118 PR Interval:  109 QRS Duration:  75 QT  Interval:  295 QTC Calculation: 414 R Axis:   92  Text Interpretation: Sinus tachycardia Borderline right axis deviation Nonspecific repol abnormality, inferior leads No significant change since last tracing Confirmed by Alvira Monday ([id number]) on 08/01/2023 3:46:18 PM  Radiology CT Head Wo Contrast Result Date: 08/01/2023 CLINICAL DATA:  Seizures EXAM: CT HEAD WITHOUT CONTRAST TECHNIQUE: Contiguous axial images were obtained from the base of the skull through the vertex without intravenous contrast. RADIATION DOSE REDUCTION: This exam was performed according to the departmental dose-optimization program which includes automated exposure control, adjustment of the mA and/or kV according to patient size and/or use of iterative reconstruction technique. COMPARISON:  09/28/2016 FINDINGS: Brain: Redemonstrated dysplastic appearance of the cerebral hemispheres, with dysgenesis of the corpus callosum and relative hypoplasia of the right cerebral hemisphere compared to  the left, with. No evidence of acute infarct, hemorrhage, mass, or mass effect. Overall unchanged size and configuration of the ventricles. No extra-axial collection. Vascular: No hyperdense vessel. Skull: Negative for fracture or focal lesion. Sinuses/Orbits: Mucosal thickening in the ethmoid air cells. No acute finding in the orbits. Other: Fluid in the left mastoid air cells. IMPRESSION: 1. No acute intracranial process. 2. 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 Electronically Signed   By: Wiliam Ke M.D.   On: 08/01/2023 19:12   DG Chest Portable 1 View Result Date: 08/01/2023 CLINICAL DATA:  Cough possible seizures, possible aspiration EXAM: PORTABLE CHEST 1 VIEW COMPARISON:  Chest x-ray June 28, 2022 FINDINGS: The cardiomediastinal silhouette is unchanged in contour. No focal pulmonary opacity.  No pleural effusion or pneumothorax. The visualized upper abdomen is unremarkable. No acute osseous abnormality. IMPRESSION: No active disease. Electronically Signed   By: Jacob Moores M.D.   On: 08/01/2023 16:52    Procedures Procedures    Medications Ordered in ED Medications  diphenhydrAMINE (BENADRYL) injection 25 mg (has no administration in time range)  levETIRAcetam (KEPPRA) IVPB 1000 mg/100 mL premix (0 mg Intravenous Stopped 08/01/23 2116)    ED Course/ Medical Decision Making/ A&P                                   27yo male with history of left-sided hemiplegic cerebral palsy, central hypothyroidism, chromosome 1 deletion syndrome, bilateral hearing loss, nonverbal, oropharyngeal dysphagia status post G-tube, agenesis of corpus callosum, seizure, hypertension presents with concern for episodes of becoming unresponsive and cyanotic.   DDx includes etiologies of syncope versus seizure.  Differential diagnosis for syncopal episodes includes cardiac arrhythmia, electrolyte abnormality, anemia, hypertension, sepsis, PE.  Chest x-ray obtained shows no evidence of pneumonia, pulmonary edema or other abnormalities.  CT head completed and personally read interpreted by me shows redemonstrated dysplastic appearance of the cerebral hemispheres, no acute intracranial bleed or abnormality.  There is a delay in labs given him being a difficult IV stick.  Labs obtained and personally read interpreted by me showed normal troponin however increased from prior, BNP within normal limits, elevated D-dimer, no anemia, no leukocytosis, no clinically significant electrolyte abnormalities, normal lactic acid  Initially in the emergency department is tachycardic to the 1 teens.  Given he is immobilized, had episodes of syncope with description of cyanosis, tachycardia, ordered D-dimer which is positive.  CT PE study is ordered and pending at time of transfer of care.  If he does not have a PE,  feel that he requires admission for syncopal episodes versus seizure.  I consulted neurology who has evaluated the patient and recommend LTM and have placed the order for that.  I ordered Keppra.        Final Clinical Impression(s) / ED Diagnoses Final diagnoses:  Syncope, unspecified syncope type  Tachycardia    Rx / DC Orders ED Discharge Orders     None         Alvira Monday, MD 08/01/23 2342

## 2023-08-01 NOTE — ED Provider Notes (Signed)
11:43 PM Assumed care from Dr. Dalene Seltzer, please see their note for full history, physical and decision making until this point. In brief this is a 27 y.o. year old male who presented to the ED tonight with Seizures     Multiple neurologic abnormalities at baseline and nonverbal. A couple episodes of unresponsiveness recently. Today with stiffening and jerking. Had some cyanotic features. H/o seizures as a child but none recently and no AED's. Neuro consulted already. Plan for admit with EEG and neuro following. Pending PE scan. Had keppra already.   CTA PE reassuring. D/w TR[facility] for admit. Neuro following. Mother updated. Patient appears stable at time of admission.    Labs, studies and imaging reviewed by myself and considered in medical decision making if ordered. Imaging interpreted by radiology.  Labs Reviewed  COMPREHENSIVE METABOLIC PANEL - Abnormal; Notable for the following components:      Result Value   Glucose, Bld 154 (*)    All other components within normal limits  D-DIMER, QUANTITATIVE - Abnormal; Notable for the following components:   D-Dimer, Quant 1.68 (*)    All other components within normal limits  CBC WITH DIFFERENTIAL/PLATELET  BRAIN NATRIURETIC PEPTIDE  URINALYSIS, W/ REFLEX TO CULTURE (INFECTION SUSPECTED)  RAPID URINE DRUG SCREEN, HOSP PERFORMED  CBG MONITORING, ED  I-STAT CG4 LACTIC ACID, ED  I-STAT CG4 LACTIC ACID, ED  TROPONIN I (HIGH SENSITIVITY)  TROPONIN I (HIGH SENSITIVITY)    CT Head Wo Contrast  Final Result    DG Chest Portable 1 View  Final Result    CT Angio Chest PE W and/or Wo Contrast    (Results Pending)    No follow-ups on file.    Marily Memos, MD 08/03/23 [phone]

## 2023-08-01 NOTE — ED Triage Notes (Signed)
BIB [facility] from home for possible seizures, patient had an episode of choking last night after eating and went cyanotic, today he had a similar episode but not eating, mother witnessed patient to have spastic movements for about 45 seconds which is not baseline for patient.   Patient currently at baseline  Paitent had another seizure like activity with this RN where he spaced out for about 30 seconds before coming back to baseline

## 2023-08-02 ENCOUNTER — Other Ambulatory Visit: Payer: Self-pay

## 2023-08-02 ENCOUNTER — Encounter (HOSPITAL_COMMUNITY): Payer: Self-pay | Admitting: Internal Medicine

## 2023-08-02 ENCOUNTER — Inpatient Hospital Stay (HOSPITAL_COMMUNITY): Payer: MEDICAID

## 2023-08-02 DIAGNOSIS — I10 Essential (primary) hypertension: Secondary | ICD-10-CM | POA: Diagnosis present

## 2023-08-02 DIAGNOSIS — R230 Cyanosis: Secondary | ICD-10-CM | POA: Diagnosis present

## 2023-08-02 DIAGNOSIS — Z79899 Other long term (current) drug therapy: Secondary | ICD-10-CM | POA: Diagnosis not present

## 2023-08-02 DIAGNOSIS — J690 Pneumonitis due to inhalation of food and vomit: Secondary | ICD-10-CM

## 2023-08-02 DIAGNOSIS — Z993 Dependence on wheelchair: Secondary | ICD-10-CM | POA: Diagnosis not present

## 2023-08-02 DIAGNOSIS — R000 Tachycardia, unspecified: Secondary | ICD-10-CM | POA: Diagnosis present

## 2023-08-02 DIAGNOSIS — H9193 Unspecified hearing loss, bilateral: Secondary | ICD-10-CM | POA: Diagnosis present

## 2023-08-02 DIAGNOSIS — Z79890 Hormone replacement therapy (postmenopausal): Secondary | ICD-10-CM | POA: Diagnosis not present

## 2023-08-02 DIAGNOSIS — G40909 Epilepsy, unspecified, not intractable, without status epilepticus: Principal | ICD-10-CM

## 2023-08-02 DIAGNOSIS — Z931 Gastrostomy status: Secondary | ICD-10-CM | POA: Diagnosis not present

## 2023-08-02 DIAGNOSIS — G808 Other cerebral palsy: Secondary | ICD-10-CM | POA: Diagnosis present

## 2023-08-02 DIAGNOSIS — Q9359 Other deletions of part of a chromosome: Secondary | ICD-10-CM | POA: Diagnosis not present

## 2023-08-02 DIAGNOSIS — R1312 Dysphagia, oropharyngeal phase: Secondary | ICD-10-CM | POA: Diagnosis present

## 2023-08-02 DIAGNOSIS — R55 Syncope and collapse: Secondary | ICD-10-CM | POA: Diagnosis present

## 2023-08-02 DIAGNOSIS — Q040 Congenital malformations of corpus callosum: Secondary | ICD-10-CM | POA: Diagnosis not present

## 2023-08-02 DIAGNOSIS — E038 Other specified hypothyroidism: Secondary | ICD-10-CM | POA: Diagnosis present

## 2023-08-02 DIAGNOSIS — Z888 Allergy status to other drugs, medicaments and biological substances status: Secondary | ICD-10-CM | POA: Diagnosis not present

## 2023-08-02 DIAGNOSIS — R569 Unspecified convulsions: Secondary | ICD-10-CM | POA: Diagnosis not present

## 2023-08-02 DIAGNOSIS — R6250 Unspecified lack of expected normal physiological development in childhood: Secondary | ICD-10-CM | POA: Diagnosis present

## 2023-08-02 LAB — HEMOGLOBIN A1C
Hgb A1c MFr Bld: 4.6 % — ABNORMAL LOW (ref 4.8–5.6)
Mean Plasma Glucose: 85.32 mg/dL

## 2023-08-02 LAB — RAPID URINE DRUG SCREEN, HOSP PERFORMED
Amphetamines: NOT DETECTED
Barbiturates: NOT DETECTED
Benzodiazepines: NOT DETECTED
Cocaine: NOT DETECTED
Opiates: NOT DETECTED
Tetrahydrocannabinol: NOT DETECTED

## 2023-08-02 LAB — CBC
HCT: 38.2 % — ABNORMAL LOW (ref 39.0–52.0)
HCT: 35.9 % — ABNORMAL LOW (ref 39.0–52.0)
Hemoglobin: 12.8 g/dL — ABNORMAL LOW (ref 13.0–17.0)
Hemoglobin: 12.2 g/dL — ABNORMAL LOW (ref 13.0–17.0)
MCH: 30.2 pg (ref 26.0–34.0)
MCH: 30.4 pg (ref 26.0–34.0)
MCHC: 33.5 g/dL (ref 30.0–36.0)
MCHC: 34 g/dL (ref 30.0–36.0)
MCV: 90.1 fL (ref 80.0–100.0)
MCV: 89.5 fL (ref 80.0–100.0)
Platelets: 175 10*3/uL (ref 150–400)
Platelets: 175 10*3/uL (ref 150–400)
RBC: 4.24 MIL/uL (ref 4.22–5.81)
RBC: 4.01 MIL/uL — ABNORMAL LOW (ref 4.22–5.81)
RDW: 13.7 % (ref 11.5–15.5)
RDW: 13.6 % (ref 11.5–15.5)
WBC: 7.2 10*3/uL (ref 4.0–10.5)
WBC: 6.8 10*3/uL (ref 4.0–10.5)
nRBC: 0 % (ref 0.0–0.2)
nRBC: 0 % (ref 0.0–0.2)

## 2023-08-02 LAB — COMPREHENSIVE METABOLIC PANEL
ALT: 25 U/L (ref 0–44)
AST: 35 U/L (ref 15–41)
Albumin: 3.5 g/dL (ref 3.5–5.0)
Alkaline Phosphatase: 79 U/L (ref 38–126)
Anion gap: 8 (ref 5–15)
BUN: 14 mg/dL (ref 6–20)
CO2: 26 mmol/L (ref 22–32)
Calcium: 9.1 mg/dL (ref 8.9–10.3)
Chloride: 104 mmol/L (ref 98–111)
Creatinine, Ser: 0.74 mg/dL (ref 0.61–1.24)
GFR, Estimated: >60 mL/min (ref >60)
Glucose, Bld: 93 mg/dL (ref 70–99)
Potassium: 3.8 mmol/L (ref 3.5–5.1)
Sodium: 138 mmol/L (ref 135–145)
Total Bilirubin: 0.7 mg/dL (ref <1.2)
Total Protein: 6.1 g/dL — ABNORMAL LOW (ref 6.5–8.1)

## 2023-08-02 LAB — HIV ANTIBODY (ROUTINE TESTING W REFLEX): HIV Screen 4th Generation wRfx: NONREACTIVE

## 2023-08-02 LAB — CBG MONITORING, ED
Glucose-Capillary: 90 mg/dL (ref 70–99)
Glucose-Capillary: 83 mg/dL (ref 70–99)

## 2023-08-02 LAB — URINALYSIS, W/ REFLEX TO CULTURE (INFECTION SUSPECTED)
Bacteria, UA: NONE SEEN
Bilirubin Urine: NEGATIVE
Glucose, UA: NEGATIVE mg/dL
Hgb urine dipstick: NEGATIVE
Ketones, ur: NEGATIVE mg/dL
Leukocytes,Ua: NEGATIVE
Nitrite: NEGATIVE
Protein, ur: NEGATIVE mg/dL
Specific Gravity, Urine: 1.032 — ABNORMAL HIGH (ref 1.005–1.030)
pH: 8 (ref 5.0–8.0)

## 2023-08-02 LAB — CREATININE, SERUM
Creatinine, Ser: 0.69 mg/dL (ref 0.61–1.24)
GFR, Estimated: >60 mL/min (ref >60)

## 2023-08-02 LAB — GLUCOSE, CAPILLARY
Glucose-Capillary: 76 mg/dL (ref 70–99)
Glucose-Capillary: 95 mg/dL (ref 70–99)

## 2023-08-02 LAB — TROPONIN I (HIGH SENSITIVITY): Troponin I (High Sensitivity): 6 ng/L (ref <18)

## 2023-08-02 MED ORDER — LEVOTHYROXINE SODIUM 25 MCG PO TABS: 50.0000 ug | ORAL_TABLET | ORAL | Status: DC

## 2023-08-02 MED ORDER — METOPROLOL TARTRATE 25 MG PO TABS: 12.5000 mg | ORAL_TABLET | Freq: Two times a day (BID) | ORAL | Status: DC

## 2023-08-02 MED ORDER — PIPERACILLIN-TAZOBACTAM 3.375 G IVPB
3.3750 g | Freq: Three times a day (TID) | INTRAVENOUS | Status: DC
  Administered 2023-08-02 – 2023-08-03 (×4): 3.375 g via INTRAVENOUS
  Filled 2023-08-02 (×4): qty 50

## 2023-08-02 MED ORDER — LEVOTHYROXINE SODIUM 50 MCG PO TABS
50.0000 ug | ORAL_TABLET | ORAL | Status: DC
Start: 2023-08-02 — End: 2023-08-03
  Administered 2023-08-02 – 2023-08-03 (×2): 50 ug
  Filled 2023-08-02: qty 2
  Filled 2023-08-02: qty 1

## 2023-08-02 MED ORDER — HEPARIN SODIUM (PORCINE) 5000 UNIT/ML IJ SOLN
5000.0000 [IU] | Freq: Three times a day (TID) | INTRAMUSCULAR | Status: DC
  Administered 2023-08-02 – 2023-08-03 (×4): 5000 [IU] via SUBCUTANEOUS
  Filled 2023-08-02 (×4): qty 1

## 2023-08-02 MED ORDER — POLYETHYLENE GLYCOL 3350 17 G PO PACK
17.0000 g | PACK | Freq: Every day | ORAL | Status: DC
  Administered 2023-08-02: 17 g
  Filled 2023-08-02: qty 1

## 2023-08-02 MED ORDER — PIPERACILLIN-TAZOBACTAM 3.375 G IVPB 30 MIN
3.3750 g | Freq: Once | INTRAVENOUS | Status: AC
  Administered 2023-08-02: 3.375 g via INTRAVENOUS
  Filled 2023-08-02: qty 50

## 2023-08-02 MED ORDER — ONDANSETRON HCL 4 MG PO TABS: 4.0000 mg | ORAL_TABLET | Freq: Four times a day (QID) | ORAL | Status: DC | PRN

## 2023-08-02 MED ORDER — LEVOTHYROXINE SODIUM 25 MCG PO TABS
50.0000 ug | ORAL_TABLET | Freq: Every day | ORAL | Status: DC
Start: 2023-08-02 — End: 2023-08-02

## 2023-08-02 MED ORDER — LEVOTHYROXINE SODIUM 25 MCG PO TABS
25.0000 ug | ORAL_TABLET | ORAL | Status: DC
Start: 2023-08-05 — End: 2023-08-03

## 2023-08-02 MED ORDER — ACETAMINOPHEN 650 MG RE SUPP: 650.0000 mg | Freq: Four times a day (QID) | RECTAL | Status: DC | PRN

## 2023-08-02 MED ORDER — ALBUTEROL SULFATE (2.5 MG/3ML) 0.083% IN NEBU: 2.5000 mg | INHALATION_SOLUTION | RESPIRATORY_TRACT | Status: DC | PRN

## 2023-08-02 MED ORDER — LEVOTHYROXINE SODIUM 25 MCG PO TABS: 25.0000 ug | ORAL_TABLET | ORAL | Status: DC

## 2023-08-02 MED ORDER — ACETAMINOPHEN 325 MG PO TABS: 650.0000 mg | ORAL_TABLET | Freq: Four times a day (QID) | ORAL | Status: DC | PRN

## 2023-08-02 MED ORDER — ONDANSETRON HCL 4 MG/2ML IJ SOLN: 4.0000 mg | Freq: Four times a day (QID) | INTRAMUSCULAR | Status: DC | PRN

## 2023-08-02 MED ORDER — METOPROLOL TARTRATE 12.5 MG HALF TABLET
12.5000 mg | ORAL_TABLET | Freq: Two times a day (BID) | ORAL | Status: DC
Start: 2023-08-02 — End: 2023-08-03
  Administered 2023-08-02 – 2023-08-03 (×2): 12.5 mg
  Filled 2023-08-02 (×2): qty 1

## 2023-08-02 MED ORDER — SODIUM CHLORIDE 0.9 % IV SOLN: INTRAVENOUS | Status: AC

## 2023-08-02 MED ORDER — VENLAFAXINE HCL ER 37.5 MG PO CP24
37.5000 mg | ORAL_CAPSULE | Freq: Every day | ORAL | Status: DC
  Administered 2023-08-03: 37.5 mg via ORAL
  Filled 2023-08-02 (×2): qty 1

## 2023-08-02 NOTE — ED Notes (Signed)
ED TO INPATIENT HANDOFF REPORT  ED Nurse Name and Phone #: Jen Mow [phone]   S Name/Age/Gender Elijah Stanley 27 y.o. male Room/Bed: 025C/025C  Code Status   Code Status: Full Code  Home/SNF/Other Home  Is this baseline? Yes   Triage Complete: Triage complete  Chief Complaint Seizure disorder [facility]) [G40.909]  Triage Note BIB [facility] from home for possible seizures, patient had an episode of choking last night after eating and went cyanotic, today he had a similar episode but not eating, mother witnessed patient to have spastic movements for about 45 seconds which is not baseline for patient.   Patient currently at baseline  Paitent had another seizure like activity with this RN where he spaced out for about 30 seconds before coming back to baseline    Allergies Allergies  Allergen Reactions   Rocephin [Ceftriaxone Sodium In Dextrose] Rash    Level of Care/Admitting Diagnosis ED Disposition     ED Disposition  Admit   Condition  --   Comment  Hospital Area: [facility] [[id number]]  Level of Care: Progressive [102]  Admit to Progressive based on following criteria: NEUROLOGICAL AND NEUROSURGICAL complex patients with significant risk of instability, who do not meet ICU criteria, yet require close observation or frequent assessment (< / = every 2 - 4 hours) with medical / nursing intervention.  May admit patient to [facility] or [facility] if equivalent level of care is available:: No  Covid Evaluation: Symptomatic Person Under Investigation (PUI) or recent exposure (last 10 days) *Testing Required*  Diagnosis: Seizure disorder [facility]) [[id number]]  Admitting Physician: Lurline Del [[id number]]  Attending Physician: Lurline Del [[id number]]  Certification:: I certify this patient will need inpatient services for at least 2 midnights  Expected Medical Readiness: 08/08/2023          B Medical/Surgery History Past Medical History:   Diagnosis Date   Cerebral palsy (HCC)    Chromosome 1 deletion syndrome    Hearing loss    bilateral   [place] lymphedema    Thyroid disease    hypothyroidism   Unilateral weakness    Left   Past Surgical History:  Procedure Laterality Date   COARCTATION OF AORTA REPAIR     GASTROSTOMY TUBE PLACEMENT     HERNIA REPAIR       A IV Location/Drains/Wounds Patient Lines/Drains/Airways Status     Active Line/Drains/Airways     Name Placement date Placement time Site Days   Peripheral IV 08/01/23 22 G 1.75" Anterior;Left Forearm 08/01/23  2018  Forearm  1   Peripheral IV 08/01/23 20 G Right Antecubital 08/01/23  2339  Antecubital  1   Gastrostomy/Enterostomy LLQ 08/15/10  --  LLQ  4735            Intake/Output Last 24 hours  Intake/Output Summary (Last 24 hours) at 08/02/2023 1303 Last data filed at 08/02/2023 0950 Gross per 24 hour  Intake 0 ml  Output --  Net 0 ml    Labs/Imaging Results for orders placed or performed during the hospital encounter of 08/01/23 (from the past 48 hours)  Comprehensive metabolic panel     Status: Abnormal   Collection Time: 08/01/23  8:20 PM  Result Value Ref Range   Sodium 137 135 - 145 mmol/L   Potassium 4.3 3.5 - 5.1 mmol/L   Chloride 100 98 - 111 mmol/L   CO2 29 22 - 32 mmol/L   Glucose, Bld 154 (H) 70 -  99 mg/dL    Comment: Glucose reference range applies only to samples taken after fasting for at least 8 hours.   BUN 19 6 - 20 mg/dL   Creatinine, Ser 1.61 0.61 - 1.24 mg/dL   Calcium 9.6 8.9 - 09.6 mg/dL   Total Protein 6.6 6.5 - 8.1 g/dL   Albumin 3.9 3.5 - 5.0 g/dL   AST 39 15 - 41 U/L   ALT 28 0 - 44 U/L   Alkaline Phosphatase 89 38 - 126 U/L   Total Bilirubin 0.6 <1.2 mg/dL   GFR, Estimated >04 >54 mL/min    Comment: (NOTE) Calculated using the CKD-EPI Creatinine Equation (2021)    Anion gap 8 5 - 15    Comment: Performed at [facility], [address], [place], [place] [address]  CBC with  Differential/Platelet     Status: None   Collection Time: 08/01/23  8:20 PM  Result Value Ref Range   WBC 7.3 4.0 - 10.5 K/uL   RBC 4.34 4.22 - 5.81 MIL/uL   Hemoglobin 13.1 13.0 - 17.0 g/dL   HCT 91.4 78.2 - 95.6 %   MCV 90.1 80.0 - 100.0 fL   MCH 30.2 26.0 - 34.0 pg   MCHC 33.5 30.0 - 36.0 g/dL   RDW 21.3 08.6 - 57.8 %   Platelets 189 150 - 400 K/uL   nRBC 0.0 0.0 - 0.2 %   Neutrophils Relative % 45 %   Neutro Abs 3.3 1.7 - 7.7 K/uL   Lymphocytes Relative 41 %   Lymphs Abs 2.9 0.7 - 4.0 K/uL   Monocytes Relative 14 %   Monocytes Absolute 1.0 0.1 - 1.0 K/uL   Eosinophils Relative 0 %   Eosinophils Absolute 0.0 0.0 - 0.5 K/uL   Basophils Relative 0 %   Basophils Absolute 0.0 0.0 - 0.1 K/uL   Immature Granulocytes 0 %   Abs Immature Granulocytes 0.02 0.00 - 0.07 K/uL    Comment: Performed at [facility], [address] [place], [place] [address]  Troponin I (High Sensitivity)     Status: None   Collection Time: 08/01/23  8:20 PM  Result Value Ref Range   Troponin I (High Sensitivity) 10 <18 ng/L    Comment: (NOTE) Elevated high sensitivity troponin I (hsTnI) values and significant  changes across serial measurements may suggest ACS but many other  chronic and acute conditions are known to elevate hsTnI results.  Refer to the "Links" section for chest pain algorithms and additional  guidance. Performed at [facility], [address] [place], [place] [address]   Brain natriuretic peptide     Status: None   Collection Time: 08/01/23  8:20 PM  Result Value Ref Range   B Natriuretic Peptide 27.3 0.0 - 100.0 pg/mL    Comment: Performed at [facility], [address], [place], [place] [address]  D-dimer, quantitative     Status: Abnormal   Collection Time: 08/01/23  8:20 PM  Result Value Ref Range   D-Dimer, Quant 1.68 (H) 0.00 - 0.50 ug/mL-FEU    Comment: (NOTE) At the manufacturer cut-off value of 0.5 g/mL FEU, this assay has a negative predictive  value of 95-100%.This assay is intended for use in conjunction with a clinical pretest probability (PTP) assessment model to exclude pulmonary embolism (PE) and deep venous thrombosis (DVT) in outpatients suspected of PE or DVT. Results should be correlated with clinical presentation. Performed at [facility]  Lab, [address] [place], [place] [address]   I-Stat CG4 Lactic Acid     Status: None   Collection Time: 08/01/23  8:34 PM  Result Value Ref Range   Lactic Acid, Venous 1.2 0.5 - 1.9 mmol/L  Troponin I (High Sensitivity)     Status: None   Collection Time: 08/01/23 11:37 PM  Result Value Ref Range   Troponin I (High Sensitivity) 6 <18 ng/L    Comment: (NOTE) Elevated high sensitivity troponin I (hsTnI) values and significant  changes across serial measurements may suggest ACS but many other  chronic and acute conditions are known to elevate hsTnI results.  Refer to the "Links" section for chest pain algorithms and additional  guidance. Performed at [facility], [address], [place], [place] [address]   I-Stat CG4 Lactic Acid     Status: None   Collection Time: 08/01/23 11:42 PM  Result Value Ref Range   Lactic Acid, Venous 0.6 0.5 - 1.9 mmol/L  HIV Antibody (routine testing w rflx)     Status: None   Collection Time: 08/02/23  3:28 AM  Result Value Ref Range   HIV Screen 4th Generation wRfx Non Reactive Non Reactive    Comment: Performed at [facility], [address], [place], [place] [address]  Hemoglobin A1c     Status: Abnormal   Collection Time: 08/02/23  3:28 AM  Result Value Ref Range   Hgb A1c MFr Bld 4.6 (L) 4.8 - 5.6 %    Comment: (NOTE) Pre diabetes:          5.7%-6.4%  Diabetes:              >6.4%  Glycemic control for   <7.0% adults with diabetes    Mean Plasma Glucose 85.32 mg/dL    Comment: Performed at [facility], [address], [place], [place] [address]  CBC     Status: Abnormal   Collection Time: 08/02/23  3:28  AM  Result Value Ref Range   WBC 7.2 4.0 - 10.5 K/uL   RBC 4.24 4.22 - 5.81 MIL/uL   Hemoglobin 12.8 (L) 13.0 - 17.0 g/dL   HCT 65.7 (L) 84.6 - 96.2 %   MCV 90.1 80.0 - 100.0 fL   MCH 30.2 26.0 - 34.0 pg   MCHC 33.5 30.0 - 36.0 g/dL   RDW 95.2 84.1 - 32.4 %   Platelets 175 150 - 400 K/uL   nRBC 0.0 0.0 - 0.2 %    Comment: Performed at [facility], [address] [place], [place] [address]  Creatinine, serum     Status: None   Collection Time: 08/02/23  3:28 AM  Result Value Ref Range   Creatinine, Ser 0.69 0.61 - 1.24 mg/dL   GFR, Estimated >72 >53 mL/min    Comment: (NOTE) Calculated using the CKD-EPI Creatinine Equation (2021) Performed at [facility], [address] [place], [place] [address]   CBG monitoring, ED     Status: None   Collection Time: 08/02/23  4:54 AM  Result Value Ref Range   Glucose-Capillary 90 70 - 99 mg/dL    Comment: Glucose reference range applies only to samples taken after fasting for at least 8 hours.  Comprehensive metabolic panel     Status: Abnormal   Collection Time: 08/02/23  4:59 AM  Result Value Ref Range   Sodium 138 135 - 145 mmol/L   Potassium 3.8 3.5 -  5.1 mmol/L   Chloride 104 98 - 111 mmol/L   CO2 26 22 - 32 mmol/L   Glucose, Bld 93 70 - 99 mg/dL    Comment: Glucose reference range applies only to samples taken after fasting for at least 8 hours.   BUN 14 6 - 20 mg/dL   Creatinine, Ser 1.61 0.61 - 1.24 mg/dL   Calcium 9.1 8.9 - 09.6 mg/dL   Total Protein 6.1 (L) 6.5 - 8.1 g/dL   Albumin 3.5 3.5 - 5.0 g/dL   AST 35 15 - 41 U/L   ALT 25 0 - 44 U/L   Alkaline Phosphatase 79 38 - 126 U/L   Total Bilirubin 0.7 <1.2 mg/dL   GFR, Estimated >04 >54 mL/min    Comment: (NOTE) Calculated using the CKD-EPI Creatinine Equation (2021)    Anion gap 8 5 - 15    Comment: Performed at [facility], [address], [place], [place] [address]  CBC     Status: Abnormal   Collection Time: 08/02/23  4:59 AM  Result Value  Ref Range   WBC 6.8 4.0 - 10.5 K/uL   RBC 4.01 (L) 4.22 - 5.81 MIL/uL   Hemoglobin 12.2 (L) 13.0 - 17.0 g/dL   HCT 91.4 (L) 78.2 - 95.6 %   MCV 89.5 80.0 - 100.0 fL   MCH 30.4 26.0 - 34.0 pg   MCHC 34.0 30.0 - 36.0 g/dL   RDW 21.3 08.6 - 57.8 %   Platelets 175 150 - 400 K/uL   nRBC 0.0 0.0 - 0.2 %    Comment: Performed at [facility], [address], [place], [place] [address]  CBG monitoring, ED     Status: None   Collection Time: 08/02/23  8:10 AM  Result Value Ref Range   Glucose-Capillary 83 70 - 99 mg/dL    Comment: Glucose reference range applies only to samples taken after fasting for at least 8 hours.   Overnight EEG with video Result Date: 08/02/2023 Charlsie Quest, MD     08/02/2023  9:36 AM Patient Name: Elijah Stanley MRN: [id number] Epilepsy Attending: Charlsie Quest Referring Physician/Provider: Erick Blinks, MD Duration: 08/02/2023 0155 to 08/02/2023 0930 Patient history: 27 y.o. male with hx of Cerebral palsy, agenesis of the corpus callosum, developmental delay, hearing loss, remote history of seizures who presents to the ED after 2 episodes concerning for syncope/seizure. EEG to evaluate for seizure Level of alertness: Awake, asleep AEDs during EEG study: None Technical aspects: This EEG study was done with scalp electrodes positioned according to the 10-20 International system of electrode placement. Electrical activity was reviewed with band pass filter of 1-70Hz , sensitivity of 7 uV/mm, display speed of 67mm/sec with a 60Hz  notched filter applied as appropriate. EEG data were recorded continuously and digitally stored.  Video monitoring was available and reviewed as appropriate. Description: The posterior dominant rhythm consists of 8 Hz activity of moderate voltage (25-35 uV) seen predominantly in posterior head regions, asymmetric ( right<left) and reactive to eye opening and eye closing. Sleep was characterized by vertex waves, sleep spindles (12 to 14  Hz), maximal frontocentral region.  EEG showed continuous 3 to 6 Hz theta-delta slowing admixed with 13 to 15 Hz beta activity in right hemisphere. Intermittent generalized 3-5hz  theta-delta slowing was also noted. Sharp transients were noted in right frontotemporal region. Hyperventilation and photic stimulation were not performed.   ABNORMALITY - Continuous slow, right hemisphere - Intermittent slow, generalized IMPRESSION: This  study is suggestive of cortical dysfunction in right hemisphere likely secondary to underlying structural abnormalities.  Additionally there is mild diffuse encephalopathy.  No seizures or definite epileptiform discharges were seen throughout the recording. Charlsie Quest   CT Angio Chest PE W and/or Wo Contrast Result Date: 08/02/2023 CLINICAL DATA:  High probability for PE.  Seizure. EXAM: CT ANGIOGRAPHY CHEST WITH CONTRAST TECHNIQUE: Multidetector CT imaging of the chest was performed using the standard protocol during bolus administration of intravenous contrast. Multiplanar CT image reconstructions and MIPs were obtained to evaluate the vascular anatomy. RADIATION DOSE REDUCTION: This exam was performed according to the departmental dose-optimization program which includes automated exposure control, adjustment of the mA and/or kV according to patient size and/or use of iterative reconstruction technique. CONTRAST:  50mL OMNIPAQUE IOHEXOL 350 MG/ML SOLN COMPARISON:  None Available. FINDINGS: Cardiovascular: Satisfactory opacification of the pulmonary arteries to the segmental level. No evidence of pulmonary embolism. Normal heart size. No pericardial effusion. Mediastinum/Nodes: The esophagus is moderately distended with air. No focal wall thickening. No mediastinal or hilar lymphadenopathy. Visualized thyroid gland and trachea are within normal limits. Lungs/Pleura: There some patchy ground-glass opacities in the bilateral lower lobes. The lungs are otherwise clear. There is no  pleural effusion or pneumothorax. Upper Abdomen: No acute abnormality. There surgical changes of the gastroesophageal junction. Musculoskeletal: No chest wall abnormality. No acute or significant osseous findings. Review of the MIP images confirms the above findings. IMPRESSION: 1. No evidence for pulmonary embolism. 2. Patchy ground-glass opacities in the bilateral lower lobes, nonspecific and may be infectious/inflammatory. 3. Moderate air distention of the esophagus. Recommend clinical correlation and follow-up. Electronically Signed   By: Darliss Cheney M.D.   On: 08/02/2023 00:25   CT Head Wo Contrast Result Date: 08/01/2023 CLINICAL DATA:  Seizures EXAM: CT HEAD WITHOUT CONTRAST TECHNIQUE: Contiguous axial images were obtained from the base of the skull through the vertex without intravenous contrast. RADIATION DOSE REDUCTION: This exam was performed according to the departmental dose-optimization program which includes automated exposure control, adjustment of the mA and/or kV according to patient size and/or use of iterative reconstruction technique. 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 No evidence of acute infarct, hemorrhage, mass, or mass effect. Overall unchanged size and configuration of the ventricles. No extra-axial collection. Vascular: No hyperdense vessel. Skull: Negative for fracture or focal lesion. Sinuses/Orbits: Mucosal thickening in the ethmoid air cells. No acute finding in the orbits. Other: Fluid in the left mastoid air cells. IMPRESSION: 1. No acute intracranial process. 2. 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 Electronically Signed   By: Wiliam Ke M.D.    On: 08/01/2023 19:12   DG Chest Portable 1 View Result Date: 08/01/2023 CLINICAL DATA:  Cough possible seizures, possible aspiration EXAM: PORTABLE CHEST 1 VIEW COMPARISON:  Chest x-ray June 28, 2022 FINDINGS: The cardiomediastinal silhouette is unchanged in contour. No focal pulmonary opacity. No pleural effusion or pneumothorax. The visualized upper abdomen is unremarkable. No acute osseous abnormality. IMPRESSION: No active disease. Electronically Signed   By: Jacob Moores M.D.   On: 08/01/2023 16:52    Pending Labs Unresulted Labs (From admission, onward)     Start     Ordered   08/01/23 1615  Urinalysis, w/ Reflex to Culture (  Infection Suspected) -Urine, Clean Catch  Once,   URGENT       Question:  Specimen Source  Answer:  Urine, Clean Catch   08/01/23 1614   08/01/23 1615  Rapid urine drug screen (hospital performed)  ONCE - STAT,   STAT        08/01/23 1614            Vitals/Pain Today's Vitals   08/02/23 0458 08/02/23 0530 08/02/23 0722 08/02/23 1118  BP:   (!) 134/59 (!) 135/91  Pulse:  84 78 79  Resp:  16 18 20   Temp: 97.9 F (36.6 C)  97.6 F (36.4 C) (!) 97.3 F (36.3 C)  TempSrc: Axillary  Axillary Axillary  SpO2:  97% 100% 99%    Isolation Precautions No active isolations  Medications Medications  diphenhydrAMINE (BENADRYL) injection 25 mg (has no administration in time range)  venlafaxine XR (EFFEXOR-XR) 24 hr capsule 37.5 mg (0 mg Oral Hold 08/02/23 0921)  polyethylene glycol (MIRALAX / GLYCOLAX) packet 17 g (has no administration in time range)  heparin injection 5,000 Units (5,000 Units Subcutaneous Given 08/02/23 0727)  0.9 %  sodium chloride infusion ( Intravenous New Bag/Given 08/02/23 0325)  ondansetron (ZOFRAN) tablet 4 mg (has no administration in time range)    Or  ondansetron (ZOFRAN) injection 4 mg (has no administration in time range)  acetaminophen (TYLENOL) tablet 650 mg (has no administration in time range)    Or   acetaminophen (TYLENOL) suppository 650 mg (has no administration in time range)  albuterol (PROVENTIL) (2.5 MG/3ML) 0.083% nebulizer solution 2.5 mg (has no administration in time range)  levothyroxine (SYNTHROID) tablet 50 mcg (50 mcg Per Tube Given 08/02/23 0726)    And  levothyroxine (SYNTHROID) tablet 25 mcg (has no administration in time range)  metoprolol tartrate (LOPRESSOR) tablet 12.5 mg (0 mg Per Tube Hold 08/02/23 0922)  piperacillin-tazobactam (ZOSYN) IVPB 3.375 g (0 g Intravenous Stopped 08/02/23 0428)    And  piperacillin-tazobactam (ZOSYN) IVPB 3.375 g (0 g Intravenous Stopped 08/02/23 1131)  levETIRAcetam (KEPPRA) IVPB 1000 mg/100 mL premix (0 mg Intravenous Stopped 08/01/23 2116)  iohexol (OMNIPAQUE) 350 MG/ML injection 60 mL (50 mLs Intravenous Contrast Given 08/01/23 2358)    Mobility walks with device     Focused Assessments Neuro Assessment Handoff:  Swallow screen pass?  SLP placed diet order Cardiac Rhythm: Normal sinus rhythm       Neuro Assessment: Exceptions to WDL Neuro Checks:      Has TPA been given? No If patient is a Neuro Trauma and patient is going to OR before floor call report to 4N Charge nurse: [phone] or [phone]   R Recommendations: See Admitting Provider Note  Report given to:   Additional Notes:

## 2023-08-02 NOTE — H&P (Signed)
History and Physical    Elijah Stanley NWG:[id number] DOB: 1995/12/22 DOA: 08/01/2023  PCP: Truett Perna, MD  Patient coming from: home  I have personally briefly reviewed patient's old medical records in [facility]  Chief Complaint: syncope Elijah Stanley episode  HPI: Elijah Stanley is a 27 y.o. male with medical history significant of  Remote seizure d/o , left-sided hemiplegic cerebral palsy, central hypothyroidism, chromosome 1 deletion syndrome, bilateral hearing loss, nonverbal, oropharyngeal dysphagia status post G-tube, agenesis of corpus callosum, hypertension who presents to the ED with history of seizure like episodes x 2 over the last 48 hours where he becomes unresponsive and cyanotic. OF note per mother prior to these episodes patient last seizure was year ago. Per mother patient 2 days ago has episode where he was noted to have onset of staring spell of and was not responsive. Per mother unclear if patient has bladder incontinence with this episode as patient at baseline has intermittent incontinence  Per mother episode lasted less than one minute and patient was back to baseline within minutes of episode. Per mother however she became more concerned when patient had another episode today however this episode was also accompanied by jerking movements.  Due to this EMS was called.  ROS unable to obtain as patient is nonverbal  ED Course:  Afeb, BP 151/105, hr 110, rr 16 sat 97% on ra  Of note in ED patient had recurrent episode where patient became altered and for around 30 seconds , however after episode patient returned to baseline.  Vitals: CTH: 1. No acute intracranial process. 2. 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   Cxr  NAD EKG: Snr twave changes inferior leads, unchanged from prior  Wbc :7.3, hgb 13.1, pt189 D-dimer  :1.68 Cr 0.73 CE:10,6 BNP27.3 CTPA IMPRESSION: 1. No evidence for pulmonary embolism. 2. Patchy ground-glass opacities in the bilateral lower lobes, nonspecific and may be infectious/inflammatory. 3. Moderate air distention of the esophagus. Recommend clinical correlation and follow-up.    Tx keppra 2100 mg load   Review of Systems: unable to obtain as patient is non verbal   Past Medical History:  Diagnosis Date   Cerebral palsy (HCC)    Chromosome 1 deletion syndrome    Hearing loss    bilateral   Milroy lymphedema    Thyroid disease    hypothyroidism   Unilateral weakness    Left    Past Surgical History:  Procedure Laterality Date   COARCTATION OF AORTA REPAIR     GASTROSTOMY TUBE PLACEMENT     HERNIA REPAIR       reports that he has never smoked. He has never used smokeless tobacco. He reports that he does not drink alcohol and does not use drugs.  Allergies  Allergen Reactions   Rocephin [Ceftriaxone Sodium In Dextrose] Rash    No family history on file.  Prior to Admission medications   Medication Sig Start Date End Date Taking? Authorizing Provider  albuterol (PROVENTIL) (2.5 MG/3ML) 0.083% nebulizer solution Inhale 3 mLs into the lungs as needed for wheezing or shortness of breath. 09/28/20  Yes [provider]  carbamide peroxide (DEBROX) 6.5 % OTIC solution Place 5 drops into both ears as needed. For ear wax 04/09/21  Yes [provider]  Desvenlafaxine Succinate ER 25 MG TB24 Take 1 tablet by mouth daily. 08/25/20  Yes [provider]  erythromycin  ophthalmic ointment Place 1 Application into both eyes at bedtime. Apply a 0.5 inch strip to both eyes at bedtime for dry eye 01/30/23  Yes [provider]  levothyroxine (SYNTHROID, LEVOTHROID) 50 MCG tablet Take 50 mcg by mouth daily before breakfast. 25 mcg on Saturdays and Sundays   Yes [provider]  metoprolol tartrate (LOPRESSOR) 25 MG tablet Take 12.5 mg by  mouth 2 (two) times daily. 09/13/21  Yes [provider]  multivitamin (VIT W/EXTRA C) CHEW chewable tablet Chew 1 tablet by mouth daily.   Yes [provider]  nystatin (MYCOSTATIN/NYSTOP) powder Apply a small amount topically to affected area 2-3x daily as needed for G-Tube site irritation 09/10/21  Yes [provider]  polyethylene glycol (MIRALAX / GLYCOLAX) packet 17 g at bedtime. Give via G-Tube   Yes [provider]  triamcinolone ointment (KENALOG) 0.1 % Apply a small amount to affected area 2-3 times daily as needed around G-tube. 09/10/21  Yes [provider]  OXYGEN Concentrator to keep O2 sats > 92% as needed    [provider]    Physical Exam: Vitals:   08/01/23 2200 08/01/23 2215 08/01/23 2230 08/02/23 0004  BP: 121/65 130/71 120/73 124/68  Pulse:    (!) 108  Resp: 16 16 17  (!) 21  Temp:    (!) 97.5 F (36.4 C)  TempSrc:    Axillary  SpO2:    99%    Constitutional: NAD, calm, comfortable sleeping Vitals:   08/01/23 2200 08/01/23 2215 08/01/23 2230 08/02/23 0004  BP: 121/65 130/71 120/73 124/68  Pulse:    (!) 108  Resp: 16 16 17  (!) 21  Temp:    (!) 97.5 F (36.4 C)  TempSrc:    Axillary  SpO2:    99%   Eyes: PERRL, lids and conjunctivae normal ENMT: Mucous membranes are dry Respiratory: clear to auscultation bilaterally, no wheezing, no crackles. Normal respiratory effort. No accessory muscle use.  Cardiovascular: Regular rate and rhythm, no murmurs / rubs / gallops. No extremity edema. 2+ pedal pulses Abdomen: no tenderness, no masses palpated. No hepatosplenomegaly. Bowel sounds positive.  Musculoskeletal: no clubbing / cyanosis. No joint deformity upper and lower extremities. Good ROM, no contractures. Normal muscle tone.  Skin: no rashes, lesions, ulcers. No induration Neurologic: CN 2-12 grossly intact. Sensation intact, MAEx4 Psychiatric: unable to asses Labs on Admission: I have personally reviewed  following labs and imaging studies  CBC: Recent Labs  Lab 08/01/23 2020  WBC 7.3  NEUTROABS 3.3  HGB 13.1  HCT 39.1  MCV 90.1  PLT 189   Basic Metabolic Panel: Recent Labs  Lab 08/01/23 2020  NA 137  K 4.3  CL 100  CO2 29  GLUCOSE 154*  BUN 19  CREATININE 0.73  CALCIUM 9.6   GFR: CrCl cannot be calculated (Unknown ideal weight.). Liver Function Tests: Recent Labs  Lab 08/01/23 2020  AST 39  ALT 28  ALKPHOS 89  BILITOT 0.6  PROT 6.6  ALBUMIN 3.9   No results for input(s): "LIPASE", "AMYLASE" in the last 168 hours. No results for input(s): "AMMONIA" in the last 168 hours. Coagulation Profile: No results for input(s): "INR", "PROTIME" in the last 168 hours. Cardiac Enzymes: No results for input(s): "CKTOTAL", "CKMB", "CKMBINDEX", "TROPONINI" in the last 168 hours. BNP (last 3 results) No results for input(s): "PROBNP" in the last 8760 hours. HbA1C: No results for input(s): "HGBA1C" in the last 72 hours. CBG: No results for input(s): "GLUCAP" in the  last 168 hours. Lipid Profile: No results for input(s): "CHOL", "HDL", "LDLCALC", "TRIG", "CHOLHDL", "LDLDIRECT" in the last 72 hours. Thyroid Function Tests: No results for input(s): "TSH", "T4TOTAL", "FREET4", "T3FREE", "THYROIDAB" in the last 72 hours. Anemia Panel: No results for input(s): "VITAMINB12", "FOLATE", "FERRITIN", "TIBC", "IRON", "RETICCTPCT" in the last 72 hours. Urine analysis:    Component Value Date/Time   COLORURINE YELLOW 09/05/2020 0354   APPEARANCEUR HAZY (A) 09/05/2020 0354   LABSPEC 1.028 09/05/2020 0354   PHURINE 5.0 09/05/2020 0354   GLUCOSEU >=500 (A) 09/05/2020 0354   HGBUR NEGATIVE 09/05/2020 0354   BILIRUBINUR NEGATIVE 09/05/2020 0354   KETONESUR 80 (A) 09/05/2020 0354   PROTEINUR NEGATIVE 09/05/2020 0354   NITRITE NEGATIVE 09/05/2020 0354   LEUKOCYTESUR NEGATIVE 09/05/2020 0354    Radiological Exams on Admission: CT Angio Chest PE W and/or Wo Contrast Result Date:  08/02/2023 CLINICAL DATA:  High probability for PE.  Seizure. EXAM: CT ANGIOGRAPHY CHEST WITH CONTRAST TECHNIQUE: Multidetector CT imaging of the chest was performed using the standard protocol during bolus administration of intravenous contrast. Multiplanar CT image reconstructions and MIPs were obtained to evaluate the vascular anatomy. RADIATION DOSE REDUCTION: This exam was performed according to the departmental dose-optimization program which includes automated exposure control, adjustment of the mA and/or kV according to patient size and/or use of iterative reconstruction technique. CONTRAST:  50mL OMNIPAQUE IOHEXOL 350 MG/ML SOLN COMPARISON:  None Available. FINDINGS: Cardiovascular: Satisfactory opacification of the pulmonary arteries to the segmental level. No evidence of pulmonary embolism. Normal heart size. No pericardial effusion. Mediastinum/Nodes: The esophagus is moderately distended with air. No focal wall thickening. No mediastinal or hilar lymphadenopathy. Visualized thyroid gland and trachea are within normal limits. Lungs/Pleura: There some patchy ground-glass opacities in the bilateral lower lobes. The lungs are otherwise clear. There is no pleural effusion or pneumothorax. Upper Abdomen: No acute abnormality. There surgical changes of the gastroesophageal junction. Musculoskeletal: No chest wall abnormality. No acute or significant osseous findings. Review of the MIP images confirms the above findings. IMPRESSION: 1. No evidence for pulmonary embolism. 2. Patchy ground-glass opacities in the bilateral lower lobes, nonspecific and may be infectious/inflammatory. 3. Moderate air distention of the esophagus. Recommend clinical correlation and follow-up. Electronically Signed   By: Darliss Cheney M.D.   On: 08/02/2023 00:25   CT Head Wo Contrast Result Date: 08/01/2023 CLINICAL DATA:  Seizures EXAM: CT HEAD WITHOUT CONTRAST TECHNIQUE: Contiguous axial images were obtained from the base of the  skull through the vertex without intravenous contrast. RADIATION DOSE REDUCTION: This exam was performed according to the departmental dose-optimization program which includes automated exposure control, adjustment of the mA and/or kV according to patient size and/or use of iterative reconstruction technique. 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 No evidence of acute infarct, hemorrhage, mass, or mass effect. Overall unchanged size and configuration of the ventricles. No extra-axial collection. Vascular: No hyperdense vessel. Skull: Negative for fracture or focal lesion. Sinuses/Orbits: Mucosal thickening in the ethmoid air cells. No acute finding in the orbits. Other: Fluid in the left mastoid air cells. IMPRESSION: 1. No acute intracranial process. 2. 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 Electronically Signed   By: Wiliam Ke M.D.   On: 08/01/2023 19:12   DG Chest Portable 1  View Result Date: 08/01/2023 CLINICAL DATA:  Cough possible seizures, possible aspiration EXAM: PORTABLE CHEST 1 VIEW COMPARISON:  Chest x-ray June 28, 2022 FINDINGS: The cardiomediastinal silhouette is unchanged in contour. No focal pulmonary opacity. No pleural effusion or pneumothorax. The visualized upper abdomen is unremarkable. No acute osseous abnormality. IMPRESSION: No active disease. Electronically Signed   By: Jacob Moores M.D.   On: 08/01/2023 16:52    Assessment/Plan  Seizure like Episode  -continue with keppra pre neurology recs - continuous EEG  ordered by neurology  - f/u with neurology recs in am  - place on seizure precautions   Possible Aspiration PNA -CTA with b/l   opacities at bases -zosyn , de-escalate as able   Central Hypothyroidism  -resume synthroid   Hypertension  -resume home regimen   Cerebral palsy Chromosome 1 deletion syndrome  Bilateral hearing loss Nonverbal   Oropharyngeal dysphagia  -status post G-tube -per mother patient takes all fluid by G tube  However does take oral meals  -speech to see      DVT prophylaxis: heparin Code Status: full/ as discussed per patient wishes in event of cardiac arrest  Family Communication: Jamaul, Fendley (Mother) [phone] (Home Phone)  Disposition Plan: patient  expected to be admitted greater than 2 midnights  Consults called: Neurology Dr Ezzie Dural Admission status: progressive care    Lurline Del MD [facility]   If 7PM-7AM, please contact night-coverage [email or web address] Password [email or web address]  08/02/2023, 1:18 AM

## 2023-08-02 NOTE — Progress Notes (Signed)
LTM EEG hooked up and running - no initial skin breakdown - push button tested - [facility] in Not monitoring while in ED.

## 2023-08-02 NOTE — Progress Notes (Signed)
Pt was moved to 5w03. Pt is not showing up online. Tech is on the way to put pt back online.

## 2023-08-02 NOTE — Progress Notes (Addendum)
Pharmacy Antibiotic Note  Elijah Stanley is a 27 y.o. male admitted on 08/01/2023 with concern for aspiration pneumonia 2/2 to seizure. PMH significant for cerebral palsy, chromosome 1 deletion syndrome, nonverbal, and oropharyngeal dysphagia (G-tube dependent). No recent positive cultures on chart review.  Pharmacy has been consulted for zosyn dosing.  Of note, pt has documented allergy to ceftriaxone and a rash occurs which was treated with benadryl (no anaphylaxis or airway issues). Pt has tolerated penicillin and Augmentin previously.   Plan: Zosyn 3.375g over 30 minutes x 1 followed by zosyn 3.375g EI q8h F/u renal function, clinical course and narrow as able    Temp (24hrs), Avg:97.8 F (36.6 C), Min:97.4 F (36.3 C), Max:98.6 F (37 C)  Recent Labs  Lab 08/01/23 2020 08/01/23 2034 08/01/23 2342  WBC 7.3  --   --   CREATININE 0.73  --   --   LATICACIDVEN  --  1.2 0.6    CrCl cannot be calculated (Unknown ideal weight.).    Allergies  Allergen Reactions   Rocephin [Ceftriaxone Sodium In Dextrose] Rash    Antimicrobials this admission: Zosyn 12/18 >  Thank you for allowing pharmacy to be a part of this patient's care.  Marja Kays 08/02/2023 1:55 AM

## 2023-08-02 NOTE — Progress Notes (Signed)
  Progress Note   Patient: Elijah Stanley BTD:[id number] DOB: Oct 28, 1995 DOA: 08/01/2023     0 DOS: the patient was seen and examined on 08/02/2023   Brief hospital course: 27yo with h/o cerebral palsy/agenesis of the corpus callosum who is nonverbal with L hemiplegia, seizure d/o, central hypothyroidism, chromosome 1 deletion syndrome, bilateral hearing loss, oropharyngeal dysphagia status post G-tube placement, and hypertension who presented on 12/17 with 48 hours of seizure-like episodes with unresponsiveness and cyanosis.  Neurology has consulted and is performing continuous EEG for further evaluation.  Concern for aspiration PNA, started on Zosyn.  Assessment and Plan:  Seizure like Episode  Continue with keppra pre neurology recs Continuous EEG  ordered by neurology  F/u with neurology recs  Seizure precautions    Possible Aspiration PNA CTA with B opacities at bases Continue Zosyn SLP evaluation    Central Hypothyroidism  Continue Synthroid    Hypertension  Continue metoprolol   Cerebral palsy Chromosome 1 deletion syndrome  Bilateral hearing loss Nonverbal Continue desvenlafaxine   Oropharyngeal dysphagia  Has G-tube - mother reports that he eats chopped/pureed foods and has significant joy with this Per mother patient takes all fluid by G tube or with pudding-thick liquids However does take oral meals  SLP evaluation- recommend parents only to feed, no liquids, will follow up tomorrow   Consultants: Neurology SLP  Procedures: Continuous EEG  Antibiotics: Zosyn 12/18-  30 Day Unplanned Readmission Risk Score    Flowsheet Row ED to Hosp-Admission (Current) from 08/01/2023 in [facility] Emergency Department at [facility]  30 Day Unplanned Readmission Risk Score (%) 8.22 Filed at 08/02/2023 0801       This score is the patient's risk of an unplanned readmission within 30 days of being discharged (0 -100%). The score is based on dignosis, age,  lab data, medications, orders, and past utilization.   Low:  0-14.9   Medium: 15-21.9   High: 22-29.9   Extreme: 30 and above           Subjective: He is awake, alert, and pleasant with mom at the bedside.  Mom provides soft/dysphagia diet with pudding-thick liquids at home.  Physical Exam: Vitals:   08/02/23 0400 08/02/23 0458 08/02/23 0530 08/02/23 0722  BP: 124/69   (!) 134/59  Pulse: 92  84 78  Resp: 18  16 18   Temp:  97.9 F (36.6 C)  97.6 F (36.4 C)  TempSrc:  Axillary  Axillary  SpO2: 97%  97% 100%     Exam:  General:  Appears calm and comfortable and is in NAD, continuous EEG ongoing Eyes:  bright, alert Cardiovascular:  RRR, no m/r/g. No LE edema.  Respiratory:   CTA bilaterally with no wheezes/rales/rhonchi.  Normal respiratory effort. Abdomen:  soft, NT, ND; G tube in place Skin:  no rash or induration seen on limited exam Musculoskeletal:  contractures on L; able to grasp and reach out with RUE; BLE with atrophy Psychiatric:  pleasant mood and affect Neurologic:  unable to effectively perform  Data Reviewed: I have reviewed the patient's lab results since admission.  Pertinent labs for today include:   Unremarkable CMP Stable CBC A1c 4.6    Family Communication: Mother present throughout evaluation  Disposition: Status is: Inpatient Remains inpatient appropriate because: ongoing management  Planned Discharge Destination: Home    Time spent: 50 minutes  Author: Jonah Blue, MD 08/02/2023 9:40 AM  For on call review [email or web address].

## 2023-08-02 NOTE — Evaluation (Signed)
Clinical/Bedside Swallow Evaluation Patient Details  Name: Elijah Stanley MRN: [id number] Date of Birth: Jun 11, 1996  Today's Date: 08/02/2023 Time: SLP Start Time (ACUTE ONLY): 1126 SLP Stop Time (ACUTE ONLY): 1148 SLP Time Calculation (min) (ACUTE ONLY): 22 min  Past Medical History:  Past Medical History:  Diagnosis Date   Cerebral palsy (HCC)    Chromosome 1 deletion syndrome    Hearing loss    bilateral   Milroy lymphedema    Thyroid disease    hypothyroidism   Unilateral weakness    Left   Past Surgical History:  Past Surgical History:  Procedure Laterality Date   COARCTATION OF AORTA REPAIR     GASTROSTOMY TUBE PLACEMENT     HERNIA REPAIR     HPI:  Elijah Stanley is a 27 y.o. male with hx of cerebral palsy, agenesis of the corpus callosum, developmental delay, hearing loss, nonverbal - uses some sign language for communication, remote history of seizures who presented to the ED after two episodes concerning for syncope/seizure and potential choking episode/s.  His mother, Elijah Stanley, was at the bedside to provide hx. Elijah Stanley has G-tube through which he receives all fluids. He had an MBS ~2005, possibly at [facility], at which time he was noted to silently aspirate liquids. He has not had liquids by mouth since that time. He has an excellent appetite, eats a range of foods, can feed himself finger foods and sometimes uses a spoon. Tends to be impulsive with eating; caregivers are present to assist with all meals.    Assessment / Plan / Recommendation  Clinical Impression  Pt was alert and interactive, vocalizing and using basic sign language to communicate with his mother, Elijah Stanley.  He participated in bedside swallowing evaluation.  Liquids were not assessed - he generally has received fluids through g-tube since around 2005 and does not take them by mouth.  He readily accepted bites of applesauce, provided to him by his mother, and demonstrated no obvious deficits.  Small bites of  cracker were provided -  pt masticated them with no s/s of impairment.  We discussed his healthy appetite, the two potential swallowing-related/choking events PTA, and we agreed to start Elijah Stanley on a more cautious diet of pureed for today. We discussed the opportunity to pursue another MBS  - it may offer further information about swallow anatomy and physiology. This decision can be made later in his admission. For now, pt can eat pureed foods - he should be fed only by his mother or father.   SLP will follow. SLP Visit Diagnosis: Dysphagia, oropharyngeal phase (R13.12)    Aspiration Risk       Diet Recommendation   Dysphagia 1 (puree) - no liquids unless thickened to pudding  Medication Administration: Via alternative means    Other  Recommendations Oral Care Recommendations: Oral care BID    Recommendations for follow up therapy are one component of a multi-disciplinary discharge planning process, led by the attending physician.  Recommendations may be updated based on patient status, additional functional criteria and insurance authorization.  Follow up Recommendations  (tba)      Assistance Recommended at Discharge  24 hour      Frequency and Duration min 2x/week  1 week       Prognosis Prognosis for improved oropharyngeal function: Good      Swallow Study   General HPI: Elijah Stanley is a 27 y.o. male with hx of cerebral palsy, agenesis of the corpus callosum, developmental delay, hearing loss, nonverbal -  uses some sign language for communication, remote history of seizures who presented to the ED after two episodes concerning for syncope/seizure and potential choking episode/s.  His mother, Elijah Stanley, was at the bedside to provide hx. Elijah Stanley has G-tube through which he receives all fluids. He had an MBS ~2005, possibly at [facility], at which time he was noted to silently aspirate liquids. He has not had liquids by mouth since that time. He has an excellent appetite, eats a range of  foods, can feed himself finger foods and sometimes uses a spoon. Tends to be impulsive with eating; caregivers are present to assist with all meals. Type of Study: Bedside Swallow Evaluation Previous Swallow Assessment: not available in EMR Diet Prior to this Study: NPO Temperature Spikes Noted: No Respiratory Status: Room air History of Recent Intubation: No Behavior/Cognition: Alert;Pleasant mood Oral Cavity Assessment: Within Functional Limits Oral Care Completed by SLP: No Oral Cavity - Dentition: Poor condition (sees a dentist regularly) Self-Feeding Abilities: Needs assist;Needs set up;Able to feed self Patient Positioning: Upright in bed Baseline Vocal Quality: Normal Volitional Cough: Cognitively unable to elicit Volitional Swallow: Unable to elicit    Oral/Motor/Sensory Function     Ice Chips Ice chips: Not tested   Thin Liquid Thin Liquid: Not tested    Nectar Thick Nectar Thick Liquid: Not tested   Honey Thick Honey Thick Liquid: Not tested   Puree Puree: Within functional limits   Solid     Solid: Within functional limits      Blenda Mounts Laurice 08/02/2023,2:18 PM  Marchelle Folks L. Samson Frederic, MA CCC/SLP Clinical Specialist - Acute Care SLP Acute Rehabilitation Services Office number [phone]

## 2023-08-02 NOTE — Progress Notes (Signed)
LTM maint complete - no skin breakdown seen.  [facility]iced [facility] monitored, Event button test confirmed by [facility].

## 2023-08-02 NOTE — Procedures (Signed)
Patient Name: Elijah Stanley  MRN: [id number]  Epilepsy Attending: Charlsie Quest  Referring Physician/Provider: Erick Blinks, MD  Duration: 08/02/2023 0155 to 08/02/2023 1016  Patient history: 27 y.o. male with hx of Cerebral palsy, agenesis of the corpus callosum, developmental delay, hearing loss, remote history of seizures who presents to the ED after 2 episodes concerning for syncope/seizure. EEG to evaluate for seizure  Level of alertness: Awake, asleep  AEDs during EEG study: None  Technical aspects: This EEG study was done with scalp electrodes positioned according to the 10-20 International system of electrode placement. Electrical activity was reviewed with band pass filter of 1-70Hz , sensitivity of 7 uV/mm, display speed of 61mm/sec with a 60Hz  notched filter applied as appropriate. EEG data were recorded continuously and digitally stored.  Video monitoring was available and reviewed as appropriate.  Description: The posterior dominant rhythm consists of 8 Hz activity of moderate voltage (25-35 uV) seen predominantly in posterior head regions, asymmetric ( right<left) and reactive to eye opening and eye closing. Sleep was characterized by vertex waves, sleep spindles (12 to 14 Hz), maximal frontocentral region.  EEG showed continuous 3 to 6 Hz theta-delta slowing admixed with 13 to 15 Hz beta activity in right hemisphere. Intermittent generalized 3-5hz  theta-delta slowing was also noted. Sharp transients were noted in right frontotemporal region. Hyperventilation and photic stimulation were not performed.    EEG was disconnected between 08/02/2023 0933 to 1524 as patient was transferred to a different room.   ABNORMALITY - Continuous slow, right hemisphere - Intermittent slow, generalized  IMPRESSION: This study is suggestive of cortical dysfunction in right hemisphere likely secondary to underlying structural abnormalities.  Additionally there is mild diffuse  encephalopathy.  No seizures or definite epileptiform discharges were seen throughout the recording.  [name] Annabelle Harman

## 2023-08-02 NOTE — Consult Note (Signed)
NEUROLOGY CONSULT NOTE   Date of service: August 02, 2023 Patient Name: Elijah Stanley MRN:  [id number] DOB:  07-13-1996 Chief Complaint: "concern for seizures" Requesting Provider: Lurline Del, MD  History of Present Illness  Elijah Stanley is a 27 y.o. male with hx of Cerebral palsy, agenesis of the corpus callosum, developmental delay, hearing loss, remote history of seizures who presents to the ED after 2 episodes concerning for syncope/seizure.  Patient is unable to provide any history secondary to his developmental delay and baseline hearing issues and nonverbal at baseline.  Spoke with patient's mother Elijah Stanley who reports last night they sat down to eat and Elijah Stanley took 3-4 bites and then he started turning blue and his head was down, this went on for about a minute and he seemed like he had stopped breathing.  She noted that he was kind of stiff and their initial thought was that he was choking and they did a Heimlich maneuver on him and called EMS.  Elijah Stanley had returned to his baseline by the time EMS arrived.  They did not think too much about this episode.  However, today, he was eating his lunch and he ate a cheeseburger and a banana he had no issues with eating at all and no coughing.  His mother Elijah Stanley gave him some tube feeds and noticed that he turned blue again, he felt back and started having these movements that look like jerking up his upper torso, he again stopped breathing for about a minute or 2.  She called 911 but Elijah Stanley was back to himself again before EMS arrived.  She later decided to have EMS bring him to the hospital to get these episodes evaluated.  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  He was weaned off of Depakote when he was 27 or 27 years old and has not had any issues or episodes concerning for seizure since.  Elijah Stanley has not had any recent fever or signs of an upper respiratory  infection except for maybe a slight runny nose.  Elijah Stanley has not had any signs or symptoms of a urinary tract infection or any signs or symptoms of a gastroenteritis.  In the ED, he had workup with CT head without contrast which redemonstrated his corpus callosum dysgenesis, package area and hypoplasia of the right cerebral hemisphere but no acute intracranial abnormality.  Neurology was consulted for further evaluation of these episodes.   ROS  Unable to ascertain due to baseline mute.  Past History   Past Medical History:  Diagnosis Date   Cerebral palsy (HCC)    Chromosome 1 deletion syndrome    Hearing loss    bilateral   [place] lymphedema    Thyroid disease    hypothyroidism   Unilateral weakness    Left    Past Surgical History:  Procedure Laterality Date   COARCTATION OF AORTA REPAIR     GASTROSTOMY TUBE PLACEMENT     HERNIA REPAIR      Family History: No family history on file.  Social History  reports that he has never smoked. He has never used smokeless tobacco. He reports that he does not drink alcohol and does not use drugs.  Allergies  Allergen Reactions   Rocephin [Ceftriaxone Sodium In Dextrose] Rash    Medications   Current Facility-Administered Medications:    0.9 %  sodium chloride infusion, , Intravenous, Continuous, Maisie Fus, Sara-Maiz A,  MD   acetaminophen (TYLENOL) tablet 650 mg, 650 mg, Per Tube, Q6H PRN **OR** acetaminophen (TYLENOL) suppository 650 mg, 650 mg, Rectal, Q6H PRN, Skip Mayer A, MD   albuterol (PROVENTIL) (2.5 MG/3ML) 0.083% nebulizer solution 2.5 mg, 2.5 mg, Nebulization, Q2H PRN, Lurline Del, MD   diphenhydrAMINE (BENADRYL) injection 25 mg, 25 mg, Intravenous, Once PRN, Erick Blinks, MD   heparin injection 5,000 Units, 5,000 Units, Subcutaneous, Q8H, Lurline Del, MD   levothyroxine (SYNTHROID) tablet 50 mcg, 50 mcg, Per Tube, Once per day on Monday Tuesday Wednesday Thursday Friday **AND** [START ON  08/05/2023] levothyroxine (SYNTHROID) tablet 25 mcg, 25 mcg, Per Tube, Once per day on Sunday Saturday, Maisie Fus, Sara-Maiz A, MD   metoprolol tartrate (LOPRESSOR) tablet 12.5 mg, 12.5 mg, Per Tube, BID, Skip Mayer A, MD   ondansetron [facility]) tablet 4 mg, 4 mg, Per Tube, Q6H PRN **OR** ondansetron (ZOFRAN) injection 4 mg, 4 mg, Intravenous, Q6H PRN, Lurline Del, MD   piperacillin-tazobactam (ZOSYN) IVPB 3.375 g, 3.375 g, Intravenous, Once **AND** piperacillin-tazobactam (ZOSYN) IVPB 3.375 g, 3.375 g, Intravenous, Q8H, Skip Mayer A, MD   polyethylene glycol (MIRALAX / GLYCOLAX) packet 17 g, 17 g, Per Tube, QHS, Lurline Del, MD   venlafaxine XR (EFFEXOR-XR) 24 hr capsule 37.5 mg, 37.5 mg, Oral, Q breakfast, Lurline Del, MD  Current Outpatient Medications:    albuterol (PROVENTIL) (2.5 MG/3ML) 0.083% nebulizer solution, Inhale 3 mLs into the lungs as needed for wheezing or shortness of breath., Disp: , Rfl:    carbamide peroxide (DEBROX) 6.5 % OTIC solution, Place 5 drops into both ears as needed. For ear wax, Disp: , Rfl:    Desvenlafaxine Succinate ER 25 MG TB24, Take 1 tablet by mouth daily., Disp: , Rfl:    erythromycin ophthalmic ointment, Place 1 Application into both eyes at bedtime. Apply a 0.5 inch strip to both eyes at bedtime for dry eye, Disp: , Rfl:    levothyroxine (SYNTHROID, LEVOTHROID) 50 MCG tablet, Take 50 mcg by mouth daily before breakfast. 25 mcg on Saturdays and Sundays, Disp: , Rfl:    metoprolol tartrate (LOPRESSOR) 25 MG tablet, Take 12.5 mg by mouth 2 (two) times daily., Disp: , Rfl:    multivitamin (VIT W/EXTRA C) CHEW chewable tablet, Chew 1 tablet by mouth daily., Disp: , Rfl:    nystatin (MYCOSTATIN/NYSTOP) powder, Apply a small amount topically to affected area 2-3x daily as needed for G-Tube site irritation, Disp: , Rfl:    polyethylene glycol (MIRALAX / GLYCOLAX) packet, 17 g at bedtime. Give via G-Tube, Disp: , Rfl:     triamcinolone ointment (KENALOG) 0.1 %, Apply a small amount to affected area 2-3 times daily as needed around G-tube., Disp: , Rfl:    OXYGEN, Concentrator to keep O2 sats > 92% as needed, Disp: , Rfl:   Vitals   Vitals:   08/01/23 2200 08/01/23 2215 08/01/23 2230 08/02/23 0004  BP: 121/65 130/71 120/73 124/68  Pulse:    (!) 108  Resp: 16 16 17  (!) 21  Temp:    (!) 97.5 F (36.4 C)  TempSrc:    Axillary  SpO2:    99%    There is no height or weight on file to calculate BMI.  Physical Exam    Physical Exam   General: sitting comfortably in bed; in no acute distress. HENT: Normal oropharynx and mucosa. Normal external appearance of ears and nose.  Neck: Supple, no pain or tenderness  CV: No JVD. No  peripheral edema.  Pulmonary: Symmetric Chest rise. Normal respiratory effort.  Abdomen: Soft to touch, non-tender.  Ext: No cyanosis, edema, or deformity  Skin: No rash. Normal palpation of skin.   Musculoskeletal: Normal digits and nails by inspection. Club foot BL.  Neurologic Examination  Mental status/Cognition: Alert, moving all extremities spontaneously and antigravity. Interacts with examiner and holds my hand.  Speech/language: vocalizes, family friend at baseline reports that this is his baseline. Cranial nerves:   CN II Pupils equal and reactive to light, makes eye contact on left and right   CN III,IV,VI Dysconjugate gaze.   CN V    CN VII Symmetric facial movements noted with smile.   CN VIII Unable to assess.   CN IX & X Protecting his airway   CN XI Holds his head up and looking left and right   CN XII Unable to assess.   Motor:  Muscle bulk: poor, tone increased slightly. Moves BL upper extremities spontaneously and antigravity. BL club foot.  Coordination/Complex Motor:  Unable to assess.  Labs/Imaging/Neurodiagnostic studies   CBC:  Recent Labs  Lab August 11, 2023 2020  WBC 7.3  NEUTROABS 3.3  HGB 13.1  HCT 39.1  MCV 90.1  PLT 189   Basic  Metabolic Panel:  Lab Results  Component Value Date   NA 137 08-11-23   K 4.3 2023-08-11   CO2 29 11-Aug-2023   GLUCOSE 154 (H) 08/11/2023   BUN 19 08-11-23   CREATININE 0.73 Aug 11, 2023   CALCIUM 9.6 2023-08-11   GFRNONAA >60 2023/08/11   Lipid Panel: No results found for: "LDLCALC" HgbA1c: No results found for: "HGBA1C" Urine Drug Screen: No results found for: "LABOPIA", "COCAINSCRNUR", "LABBENZ", "AMPHETMU", "THCU", "LABBARB"  Alcohol Level No results found for: "ETH" INR No results found for: "INR" APTT No results found for: "APTT" AED levels: No results found for: "PHENYTOIN", "ZONISAMIDE", "LAMOTRIGINE", "LEVETIRACETA"  CT Head without contrast(Personally reviewed): 1. No acute intracranial process. 2. 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  Neurodiagnostics cEEG:  pending  ASSESSMENT   Elijah Stanley is a 27 y.o. male with hx of Cerebral palsy, agenesis of the corpus callosum, developmental delay, hearing loss, remote history of seizures who presents to the ED after 2 episodes concerning for syncope/seizure. He stops responding > not breathing > turns blue back to his baseline. The episodes last about a minute and he has had 2 in the last 24 hours.  With his hx of cerebral palsy, prior hx of partial seizures, seizures on the differential. Clinically unclear if these episodes truly are seizures. Given he has had 2 in the last 24 hours, will put him up on cEEG to see if we can characterize these events. Primary team evaluating for alternative etiologies including syncope.  RECOMMENDATIONS  - cEEG - seizure precautions. - further workup pending above.  ______________________________________________________________________    Signed, Erick Blinks, MD [facility]

## 2023-08-03 ENCOUNTER — Telehealth (HOSPITAL_COMMUNITY): Payer: Self-pay

## 2023-08-03 ENCOUNTER — Encounter (HOSPITAL_COMMUNITY): Payer: MEDICAID

## 2023-08-03 ENCOUNTER — Other Ambulatory Visit (HOSPITAL_COMMUNITY): Payer: Self-pay

## 2023-08-03 ENCOUNTER — Inpatient Hospital Stay (HOSPITAL_COMMUNITY): Payer: MEDICAID

## 2023-08-03 DIAGNOSIS — G40909 Epilepsy, unspecified, not intractable, without status epilepticus: Secondary | ICD-10-CM | POA: Diagnosis not present

## 2023-08-03 DIAGNOSIS — R569 Unspecified convulsions: Secondary | ICD-10-CM

## 2023-08-03 DIAGNOSIS — R55 Syncope and collapse: Secondary | ICD-10-CM

## 2023-08-03 LAB — GLUCOSE, CAPILLARY
Glucose-Capillary: 102 mg/dL — ABNORMAL HIGH (ref 70–99)
Glucose-Capillary: 63 mg/dL — ABNORMAL LOW (ref 70–99)
Glucose-Capillary: 84 mg/dL (ref 70–99)

## 2023-08-03 MED ORDER — SODIUM CHLORIDE 0.9 % IV SOLN
INTRAVENOUS | Status: DC
Start: 2023-08-03 — End: 2023-08-03

## 2023-08-03 MED ORDER — AMOXICILLIN-POT CLAVULANATE 875-125 MG PO TABS
1.0000 | ORAL_TABLET | Freq: Two times a day (BID) | ORAL | 0 refills | Status: AC
  Filled 2023-08-03: qty 8, 4d supply, fill #0

## 2023-08-03 MED ORDER — VALTOCO 10 MG DOSE 10 MG/0.1ML NA LIQD
10.0000 mg | NASAL | 2 refills | Status: AC | PRN
  Filled 2023-08-03: qty 10, 30d supply, fill #0

## 2023-08-03 NOTE — Progress Notes (Signed)
vLTM discontinued  No skinn breakdown noted at all skin sites.  [facility] notified

## 2023-08-03 NOTE — TOC Transition Note (Signed)
Transition of Care [facility]) - Discharge Note   Patient Details  Name: Elijah Stanley MRN: [id number] Date of Birth: 31-May-1996  [facility] of [facility] CM/SW Contact:  Gordy Clement, RN Phone Number: 08/03/2023, 10:19 AM   Clinical Narrative:    Patient will DC to home today No TOC needs identified Mother will transport home.           Patient Goals and CMS Choice            Discharge Placement                       Discharge Plan and Services Additional resources added to the After Visit Summary for                                       Social Drivers of Health (SDOH) Interventions SDOH Screenings   Food Insecurity: Patient Unable To Answer (08/02/2023)  Housing: Patient Unable To Answer (08/02/2023)  Transportation Needs: No Transportation Needs (08/02/2023)  Utilities: Not At Risk (08/02/2023)  Tobacco Use: Low Risk  (08/02/2023)     Readmission Risk Interventions     No data to display

## 2023-08-03 NOTE — Telephone Encounter (Signed)
Pharmacy Patient Advocate Encounter  Insurance verification completed.    The patient is insured through [employer] [place].     Ran test claim for Valtoco and the current 30 day co-pay is $0.00.   This test claim was processed through [facility]- copay amounts may vary at other pharmacies due to pharmacy/plan contracts, or as the patient moves through the different stages of their insurance plan.

## 2023-08-03 NOTE — Progress Notes (Signed)
Subjective: No further seizure like activity.  Per mother at bedside, patient had staring spells when he was about 27-year-old.  It was unclear if those are epileptic or behavioral.  However he was started on Depakene which was gradually weaned off.  In 2021 he had 1 episode of seizure-like activity after he was bit by a bee.  Was seen by teleneurology at [facility] and not started on any medications at this suspected to be a reaction to the bee sting.  Per mother 2 days ago prior to arrival, he was eating some asparagus and [place] and suddenly looked like he was having trouble breathing and turning blue as if he is choking.  Patient's father did have like.  Patient gradually recovered.  No seizure-like activity was noted.  Next day patient was again sitting in his wheelchair and suddenly slumped back, looks like he was turning blue followed by jerking of bilateral upper extremities lasting for less than 1 minute, not associated with tongue bite.  Mother is not sure patient's eyes were open or closed.  Patient very quickly returned to baseline.  Per mother they have a pulse ox at home and his O2 sat was about 91%.  She denies any recent medication changes, recent illness.  ROS: Unable to obtain due to poor mental status  Examination  Vital signs in last 24 hours: Temp:  [97.1 F (36.2 C)-97.7 F (36.5 C)] 97.7 F (36.5 C) (12/19 0400) Pulse Rate:  [67-86] 67 (12/19 0400) Resp:  [9-20] 9 (12/19 0400) BP: (105-147)/(59-91) 107/70 (12/19 0400) SpO2:  [95 %-100 %] 95 % (12/19 0400) Weight:  [38.1 kg] 38.1 kg (12/19 0500)  General: lying in bed, NAD Neuro: Awake, alert, nonverbal at baseline, smiling and grabbing examiner's hands with his right hand repeatedly, left side appears weak, spontaneously moving bilateral lower extremities in bed.  Per mother at baseline he is able to walk to the bathroom with a walker but is predominantly wheelchair-bound  Basic Metabolic Panel: Recent Labs  Lab  08/01/23 2020 08/02/23 0328 08/02/23 0459  NA 137  --  138  K 4.3  --  3.8  CL 100  --  104  CO2 29  --  26  GLUCOSE 154*  --  93  BUN 19  --  14  CREATININE 0.73 0.69 0.74  CALCIUM 9.6  --  9.1    CBC: Recent Labs  Lab 08/01/23 2020 08/02/23 0328 08/02/23 0459  WBC 7.3 7.2 6.8  NEUTROABS 3.3  --   --   HGB 13.1 12.8* 12.2*  HCT 39.1 38.2* 35.9*  MCV 90.1 90.1 89.5  PLT 189 175 175     Coagulation Studies: No results for input(s): "LABPROT", "INR" in the last 72 hours.  Imaging : No new brain imaging overnight   ASSESSMENT AND PLAN: 27 y.o. male with hx of Cerebral palsy, agenesis of the corpus callosum, developmental delay, hearing loss, remote history of seizures who presents to the ED after 2 episodes concerning for syncope/seizure. He stops responding > not breathing > turns blue back to his baseline. The episodes last about a minute and he has had 2 in the last 24 hours.   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  However per description, the semiology of the episode is not typical of a focal seizure.  His EEG also did not show any definite ictal-interictal abnormality -I discussed with mother that  because the episodes are not very typical, we can prescribe rescue medication for now.  However if episodes happen again, then we will consider starting Depakene and do prolonged video EEG monitoring -Mother understands and agrees with the plan -Sent prescription for intranasal Valtoco 10 mg to our pharmacy -Will also schedule follow-up with Dr. Teresa Coombs at [facility] -Discussed seizure precautions -Discussed plan with Dr. Thedore Mins via secure chat -Neurology will sign off.  Please call us for any further questions  Seizure precautions: Take showers instead of baths. Ensure the water temperature is not too high on the home water heater. Maintain good  sleep hygiene.   If patient has another seizure, call 911 and bring them back to the ED if: A.  The seizure lasts longer than 5 minutes.      B.  The patient doesn't wake shortly after the seizure or has new problems such as difficulty seeing, speaking or moving following the seizure C.  The patient was injured during the seizure D.  The patient has a temperature over 102 F (39C) E.  The patient vomited during the seizure and now is having trouble breathing    During the Seizure   - First, ensure adequate ventilation and place patients on the floor on their left side  Loosen clothing around the neck and ensure the airway is patent. If the patient is clenching the teeth, do not force the mouth open with any object as this can cause severe damage - Remove all items from the surrounding that can be hazardous. The patient may be oblivious to what's happening and may not even know what he or she is doing. If the patient is confused and wandering, either gently guide him/her away and block access to outside areas - Reassure the individual and be comforting - Call 911. In most cases, the seizure ends before EMS arrives. However, there are cases when seizures may last over 3 to 5 minutes. Or the individual may have developed breathing difficulties or severe injuries. If a pregnant patient or a person with diabetes develops a seizure, it is prudent to call an ambulance.     After the Seizure (Postictal Stage)   After a seizure, most patients experience confusion, fatigue, muscle pain and/or a headache. Thus, one should permit the individual to sleep. For the next few days, reassurance is essential. Being calm and helping reorient the person is also of importance.   Most seizures are painless and end spontaneously. Seizures are not harmful to others but can lead to complications such as stress on the lungs, brain and the heart. Individuals with prior lung problems may develop labored breathing and  respiratory distress.     I have spent a total of   38 minutes with the patient reviewing hospital notes,  test results, labs and examining the patient as well as establishing an assessment and plan that was discussed personally with the patient.  > 50% of time was spent in direct patient care.     Lindie Spruce Epilepsy [facility] For questions after 5pm please refer to AMION to reach the Neurologist on call

## 2023-08-03 NOTE — Discharge Instructions (Signed)
Do not drive, operate heavy machinery, perform activities at heights, swimming or participation in water activities or provide baby sitting services until you have seen by Primary MD or a Neurologist and advised to do so again.  Follow with Primary MD Truett Perna, MD in 7 days   Get CBC, CMP, 2 view Chest X ray -  checked next visit with your primary MD   Activity: As tolerated with Full fall precautions use walker/cane & assistance as needed  Disposition Home     Diet: Dysphagia 1 diet with pudding thick liquids with feeding assistance and aspiration precautions.   Special Instructions: If you have smoked or chewed Tobacco  in the last 2 yrs please stop smoking, stop any regular Alcohol  and or any Recreational drug use.  On your next visit with your primary care physician please Get Medicines reviewed and adjusted.  Please request your Prim.MD to go over all Hospital Tests and Procedure/Radiological results at the follow up, please get all Hospital records sent to your Prim MD by signing hospital release before you go home.  If you experience worsening of your admission symptoms, develop shortness of breath, life threatening emergency, suicidal or homicidal thoughts you must seek medical attention immediately by calling 911 or calling your MD immediately  if symptoms less severe.  You Must read complete instructions/literature along with all the possible adverse reactions/side effects for all the Medicines you take and that have been prescribed to you. Take any new Medicines after you have completely understood and accpet all the possible adverse reactions/side effects.   Do not drive when taking Pain medications.  Do not take more than prescribed Pain, Sleep and Anxiety Medications  Wear Seat belts while driving.

## 2023-08-03 NOTE — Progress Notes (Signed)
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 No acute PT needs identified. Will sign off.)   Gladys Damme 08/03/2023, 10:13 AM

## 2023-08-03 NOTE — Progress Notes (Signed)
TOC meds delivered to patient by this nurse

## 2023-08-03 NOTE — Plan of Care (Signed)

## 2023-08-03 NOTE — Discharge Summary (Signed)
JLYNN ENBERG WUJ:[id number] DOB: 1996/07/14 DOA: 08/01/2023  PCP: Truett Perna, MD  Admit date: 08/01/2023  Discharge date: 08/03/2023  Admitted From: Home   Disposition:  Home   Recommendations for Outpatient Follow-up:   Follow up with PCP in 1-2 weeks  PCP Please obtain BMP/CBC, 2 view CXR in 1week,  (see Discharge instructions)   PCP Please follow up on the following pending results:    Home Health: None   Equipment/Devices: None  Consultations: Neuro Discharge Condition: Stable    CODE STATUS: Full    Diet Recommendation:   Diet Order             DIET - DYS 1 Room service appropriate? 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  Neurology has consulted and is performing continuous EEG for further evaluation.  Concern for aspiration PNA, started on Zosyn.                                                                  Hospital Course   Seizure like Episode  Seen by neurology, underwent overnight EEG which showed nonspecific changes but no active seizures, case discussed with seizure MD Dr. Melynda Ripple, for now will be discharged on rescue benzodiazepine for seizures, no scheduled AEDs.  If these symptoms recur then may placed on Depakene, will have him  follow-up with PCP and his neurologist.  The symptoms have resolved.  Question if he had more pulmonary symptoms due to aspiration pneumonia   Possible Aspiration PNA Seen by speech, kindly see discussion below, was treated with Zosyn here, per mother he is back to baseline will be placed on 4 more days of oral Augmentin upon discharge   Central Hypothyroidism  Continue Synthroid    Hypertension  Continue metoprolol   Cerebral palsy Chromosome 1 deletion syndrome  Bilateral hearing loss Nonverbal Continue desvenlafaxine   Oropharyngeal dysphagia  Has G-tube - mother reports that he eats chopped/pureed foods and has significant joy with this Per mother patient takes all fluid by G tube or with pudding-thick liquids In by speech currently on dysphagia 1 diet for comfort along with pudding thick liquids.  Home speech therapy also ordered.    Discharge diagnosis  Principal Problem:   Seizure disorder [facility])    Discharge instructions    Discharge Instructions     Ambulatory referral to Neurology   Complete by: As directed    An appointment is requested in approximately: 8-12 weeks   Discharge instructions   Complete by: As directed    Do not drive, operate heavy machinery, perform activities at heights, swimming or participation in water activities or provide baby sitting services until you have seen by Primary MD or a Neurologist and advised to do so again.  Follow with Primary MD Truett Perna, MD in 7 days   Get CBC, CMP, 2 view Chest X ray -  checked next visit with your primary MD   Activity: As tolerated with Full fall precautions use walker/cane & assistance as needed  Disposition Home     Diet: Dysphagia 1 diet with pudding thick liquids with feeding assistance and aspiration precautions.   Special Instructions: If you have smoked or chewed Tobacco  in the last 2 yrs please stop smoking, stop any regular Alcohol  and or any Recreational drug use.  On your next  visit with your primary care physician please Get Medicines reviewed and adjusted.  Please request your Prim.MD to go over all Hospital Tests and Procedure/Radiological results at the follow up, please get all Hospital records sent to your Prim MD by signing hospital release before you go home.  If you experience worsening of your admission symptoms, develop shortness of breath, life threatening emergency, suicidal or homicidal thoughts you must seek medical attention immediately by calling 911 or calling your MD immediately  if symptoms less severe.  You Must read complete instructions/literature along with all the possible adverse reactions/side effects for all the Medicines you take and that have been prescribed to you. Take any new Medicines after you have completely understood and accpet all the possible adverse reactions/side effects.   Do not drive when tak   Increase activity slowly   Complete by: As directed        Discharge Medications   Allergies as of 08/03/2023       Reactions   Rocephin [ceftriaxone Sodium In Dextrose] Rash        Medication List     TAKE these medications    albuterol (2.5 MG/3ML) 0.083% nebulizer solution Commonly known as: PROVENTIL Inhale 3 mLs into the lungs as needed for wheezing or shortness of breath.   amoxicillin-clavulanate 875-125 MG tablet Commonly known as: AUGMENTIN Take 1 tablet by mouth 2 (two) times daily.   carbamide peroxide 6.5 % OTIC solution Commonly known as: DEBROX Place 5 drops into both ears as needed. For ear wax   desvenlafaxine 25 MG 24 hr tablet Commonly known as: PRISTIQ Take 1 tablet by mouth daily.   erythromycin ophthalmic ointment Place 1 Application into both eyes at bedtime. Apply a 0.5 inch strip to both eyes at bedtime for dry eye   levothyroxine 50 MCG tablet Commonly known as: SYNTHROID Take 50 mcg by mouth daily before breakfast. 25 mcg on Saturdays and Sundays   metoprolol tartrate 25 MG  tablet Commonly known as: LOPRESSOR Take 12.5 mg by mouth 2 (two) times daily.   multivitamin Chew chewable tablet Chew 1 tablet by mouth daily.   nystatin powder Commonly known as: MYCOSTATIN/NYSTOP Apply a small amount topically to affected area 2-3x daily as needed for G-Tube site irritation   OXYGEN Concentrator to keep O2 sats > 92% as needed   polyethylene glycol 17  g packet Commonly known as: MIRALAX / GLYCOLAX 17 g at bedtime. Give via G-Tube   triamcinolone ointment 0.1 % Commonly known as: KENALOG Apply a small amount to affected area 2-3 times daily as needed around G-tube.   Valtoco 10 MG Dose 10 MG/0.1ML Liqd Generic drug: diazePAM Place 10 mg into the nose as needed (seizure lasting over 2 minutes).         Follow-up Information     Truett Perna, MD. Schedule an appointment as soon as possible for a visit in 1 week(s).   Specialty: Internal Medicine Why: And your neurologist within a week of discharge Contact information: [address] [place] [address] [phone]         [facility]. Schedule an appointment as soon as possible for a visit in 1 week(s).   Contact information: [address]     [address] [place] [address] [phone]                Major procedures and Radiology Reports - PLEASE review detailed and final reports thoroughly  -        Overnight EEG with video Result Date: 08/02/2023 Charlsie Quest, MD     08/03/2023  6:16 AM Patient Name: CIEL ARRENDONDO MRN: [id number] Epilepsy Attending: Charlsie Quest Referring Physician/Provider: Erick Blinks, MD Duration: 08/02/2023 0155 to 08/02/2023 0615 Patient history: 27 y.o. male with hx of Cerebral palsy, agenesis of the corpus callosum, developmental delay, hearing loss, remote history of seizures who presents to the ED after 2 episodes concerning for syncope/seizure. EEG to evaluate for seizure Level of  alertness: Awake, asleep AEDs during EEG study: None Technical aspects: This EEG study was done with scalp electrodes positioned according to the 10-20 International system of electrode placement. Electrical activity was reviewed with band pass filter of 1-70Hz , sensitivity of 7 uV/mm, display speed of 82mm/sec with a 60Hz  notched filter applied as appropriate. EEG data were recorded continuously and digitally stored.  Video monitoring was available and reviewed as appropriate. Description: The posterior dominant rhythm consists of 8 Hz activity of moderate voltage (25-35 uV) seen predominantly in posterior head regions, asymmetric ( right<left) and reactive to eye opening and eye closing. Sleep was characterized by vertex waves, sleep spindles (12 to 14 Hz), maximal frontocentral region.  EEG showed continuous 3 to 6 Hz theta-delta slowing admixed with 13 to 15 Hz beta activity in right hemisphere. Intermittent generalized 3-5hz  theta-delta slowing was also noted. Sharp transients were noted in right frontotemporal region. Hyperventilation and photic stimulation were not performed.  EEG was disconnected between 08/02/2023 0933 to 1524 as patient was transferred to a different room. ABNORMALITY - Continuous slow, right hemisphere - Intermittent slow, generalized IMPRESSION: This study is suggestive of cortical dysfunction in right hemisphere likely secondary to underlying structural abnormalities.  Additionally there is mild diffuse encephalopathy.  No seizures or definite epileptiform discharges were seen throughout the recording. Charlsie Quest   CT Angio Chest PE W and/or Wo Contrast Result Date: 08/02/2023 CLINICAL DATA:  High probability for PE.  Seizure. EXAM: CT ANGIOGRAPHY CHEST WITH CONTRAST TECHNIQUE: Multidetector CT imaging of the chest was performed using the standard protocol during bolus administration of intravenous contrast. Multiplanar CT image reconstructions and MIPs were obtained to  evaluate the vascular anatomy. RADIATION DOSE REDUCTION: This exam was performed according to the departmental dose-optimization program which includes automated exposure control, adjustment of the mA and/or kV according to patient size  and/or use of iterative reconstruction technique. CONTRAST:  50mL OMNIPAQUE IOHEXOL 350 MG/ML SOLN COMPARISON:  None Available. FINDINGS: Cardiovascular: Satisfactory opacification of the pulmonary arteries to the segmental level. No evidence of pulmonary embolism. Normal heart size. No pericardial effusion. Mediastinum/Nodes: The esophagus is moderately distended with air. No focal wall thickening. No mediastinal or hilar lymphadenopathy. Visualized thyroid gland and trachea are within normal limits. Lungs/Pleura: There some patchy ground-glass opacities in the bilateral lower lobes. The lungs are otherwise clear. There is no pleural effusion or pneumothorax. Upper Abdomen: No acute abnormality. There surgical changes of the gastroesophageal junction. Musculoskeletal: No chest wall abnormality. No acute or significant osseous findings. Review of the MIP images confirms the above findings. IMPRESSION: 1. No evidence for pulmonary embolism. 2. Patchy ground-glass opacities in the bilateral lower lobes, nonspecific and may be infectious/inflammatory. 3. Moderate air distention of the esophagus. Recommend clinical correlation and follow-up. Electronically Signed   By: Darliss Cheney M.D.   On: 08/02/2023 00:25   CT Head Wo Contrast Result Date: 08/01/2023 CLINICAL DATA:  Seizures EXAM: CT HEAD WITHOUT CONTRAST TECHNIQUE: Contiguous axial images were obtained from the base of the skull through the vertex without intravenous contrast. RADIATION DOSE REDUCTION: This exam was performed according to the departmental dose-optimization program which includes automated exposure control, adjustment of the mA and/or kV according to patient size and/or use of iterative reconstruction technique.  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 No evidence of acute infarct, hemorrhage, mass, or mass effect. Overall unchanged size and configuration of the ventricles. No extra-axial collection. Vascular: No hyperdense vessel. Skull: Negative for fracture or focal lesion. Sinuses/Orbits: Mucosal thickening in the ethmoid air cells. No acute finding in the orbits. Other: Fluid in the left mastoid air cells. IMPRESSION: 1. No acute intracranial process. 2. 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 Electronically Signed   By: Wiliam Ke M.D.   On: 08/01/2023 19:12   DG Chest Portable 1 View Result Date: 08/01/2023 CLINICAL DATA:  Cough possible seizures, possible aspiration EXAM: PORTABLE CHEST 1 VIEW COMPARISON:  Chest x-ray June 28, 2022 FINDINGS: The cardiomediastinal silhouette is unchanged in contour. No focal pulmonary opacity. No pleural effusion or pneumothorax. The visualized upper abdomen is unremarkable. No acute osseous abnormality. IMPRESSION: No active disease. Electronically Signed   By: Jacob Moores M.D.   On: 08/01/2023 16:52    Micro Results     No results found for this or any previous visit (from the past 240 hours).  Today   Subjective    Elissa Lovett today in bed, mother bedside, patient in no distress and sleeping, nonverbal at baseline   Objective   Blood pressure 120/82, pulse 79, temperature 97.6 F (36.4 C), temperature source Axillary, resp. rate 19, weight 38.1 kg, SpO2 95%.   Intake/Output Summary (Last 24 hours) at 08/03/2023 1017 Last data filed at 08/02/2023 1519 Gross per 24 hour  Intake 527.41 ml  Output --  Net 527.41 ml     Exam  Sleeping but comfortable, mother bedside says that he is back to his baseline [place].AT,PERRAL Supple Neck,   Symmetrical Chest wall movement, Good air movement bilaterally, CTAB RRR,No Gallops,   +ve B.Sounds, Abd Soft, Non tender,  No edema    Data Review  Recent Labs  Lab 08/01/23 2020 08/02/23 0328 08/02/23 0459  WBC 7.3 7.2 6.8  HGB 13.1 12.8* 12.2*  HCT 39.1 38.2* 35.9*  PLT 189 175 175  MCV 90.1 90.1 89.5  MCH 30.2 30.2 30.4  MCHC 33.5 33.5 34.0  RDW 13.5 13.7 13.6  LYMPHSABS 2.9  --   --   MONOABS 1.0  --   --   EOSABS 0.0  --   --   BASOSABS 0.0  --   --     Recent Labs  Lab 08/01/23 2020 08/01/23 2034 08/01/23 2342 08/02/23 0328 08/02/23 0459  NA 137  --   --   --  138  K 4.3  --   --   --  3.8  CL 100  --   --   --  104  CO2 29  --   --   --  26  ANIONGAP 8  --   --   --  8  GLUCOSE 154*  --   --   --  93  BUN 19  --   --   --  14  CREATININE 0.73  --   --  0.69 0.74  AST 39  --   --   --  35  ALT 28  --   --   --  25  ALKPHOS 89  --   --   --  79  BILITOT 0.6  --   --   --  0.7  ALBUMIN 3.9  --   --   --  3.5  DDIMER 1.68*  --   --   --   --   LATICACIDVEN  --  1.2 0.6  --   --   HGBA1C  --   --   --  4.6*  --   BNP 27.3  --   --   --   --   CALCIUM 9.6  --   --   --  9.1    Total Time in preparing paper work, data evaluation and todays exam - 35 minutes  Signature  -    Susa Raring M.D on 08/03/2023 at 10:17 AM   -  To page go to [email or web address]

## 2023-08-04 ENCOUNTER — Encounter: Payer: Self-pay | Admitting: Internal Medicine

## 2023-08-04 ENCOUNTER — Ambulatory Visit (HOSPITAL_COMMUNITY): Admission: RE | Admit: 2023-08-04 | Payer: MEDICAID | Source: Ambulatory Visit

## 2023-08-04 NOTE — Progress Notes (Signed)
   08/03/23 1400  SLP Visit Information  SLP Received On 08/04/23  General Information  Behavior/Cognition Alert;Cooperative;Pleasant mood  Patient Positioning Partially reclined  HPI Elijah Stanley is a 27 y.o. male with hx of cerebral palsy, agenesis of the corpus callosum, developmental delay, hearing loss, nonverbal - uses some sign language for communication, remote history of seizures who presented to the ED after two episodes concerning for syncope/seizure and potential choking episode/s.  His mother, Elijah Stanley, was at the bedside to provide hx. Dusten has G-tube through which he receives all fluids. He had an MBS ~2005, possibly at [facility], at which time he was noted to silently aspirate liquids. He has not had liquids by mouth since that time. He has an excellent appetite, eats a range of foods, can feed himself finger foods and sometimes uses a spoon. Tends to be impulsive with eating; caregivers are present to assist with all meals.  Treatment Provided  Treatment provided Dysphagia  Pt and mother seen while preparing to discharge. Mother says her plan is to continue pureed foods after d/c. SLP asked her more about intermittent choking or possible regurgitation. Observed pt with small piece of a cookie, which would be a food he likes. Pts mother broke off a small piece and pt mashed it one or twice with his tongue after a delay and swallowed. Mother reports she has observed that with some foods, like macaroni and cheese, he doesn't feel the need to chew and swallows the whole bolus and sometimes seems to need to regurgitate it. We talked about esophageal motility, how lack of mastication can impact that as well as no fluids to lubricate and encourage peristalsis. We decided to continue to fine chop or mince all foods, add extra gravy or sauce and follow bites with pudding thick liquids which she hadn't been doing. Hopeful these strategies could be helpful.   Dysphagia Treatment  Temperature Spikes  Noted No  Respiratory Status Room air  Oral Cavity - Dentition Poor condition  Treatment Methods Skilled observation;Upgraded PO texture trial  Family/Caregiver Educated mother  Patient observed directly with PO's Yes  Type of PO's observed Dysphagia 3 (soft)  Feeding Total assist  Oral Phase Signs & Symptoms Other (comment) (incomplete mastication)  Assessment / Recommendations / Plan  Plan All goals met  Dysphagia Recommendations  Diet recommendations Dysphagia 2 (fine chop);Pudding-thick liquid  Liquids provided via Teaspoon  Medication Administration Via alternative means  Compensations Follow solids with liquid (pudding thick)  Progression Toward Goals  Progression toward goals Goals met, education completed, patient discharged from SLP  SLP Time Calculation  SLP Start Time (ACUTE ONLY) 1140  SLP Stop Time (ACUTE ONLY) 1200  SLP Time Calculation (min) (ACUTE ONLY) 20 min  SLP Evaluations  $ SLP Speech Visit 1 Visit  SLP Evaluations  $Swallowing Treatment 1 Procedure

## 2023-09-06 ENCOUNTER — Telehealth: Payer: Self-pay | Admitting: Neurology

## 2023-09-06 NOTE — Telephone Encounter (Signed)
Pt's mother, Ahmon Gair LVM at 10:43 am.Calling to cancel appointment in March. His PCP was able to get him in sooner to see someone regarding these seizure he is having.

## 2023-10-30 ENCOUNTER — Ambulatory Visit: Payer: MEDICAID | Admitting: Neurology
